# Patient Record
Sex: Male | Born: 1995 | Race: Black or African American | Hispanic: No | Marital: Single | Smoking: Current some day smoker
Health system: Southern US, Community
[De-identification: ages and names within clinical notes are randomized; demographics above are authoritative.]

## PROBLEM LIST (undated history)

## (undated) DIAGNOSIS — Z789 Other specified health status: Secondary | ICD-10-CM

## (undated) HISTORY — PX: NO PAST SURGERIES: SHX2092

---
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
## 2013-01-02 NOTE — Progress Notes (Signed)
Left the message on the VM that Labs good. OV in Feb and he will be called.

## 2013-02-13 ENCOUNTER — Ambulatory Visit: Payer: Self-pay | Admitting: Family Medicine

## 2013-02-17 ENCOUNTER — Other Ambulatory Visit: Payer: Self-pay | Admitting: Gastroenterology

## 2013-02-17 ENCOUNTER — Telehealth: Payer: Self-pay | Admitting: Gastroenterology

## 2013-02-17 DIAGNOSIS — K625 Hemorrhage of anus and rectum: Secondary | ICD-10-CM

## 2013-02-17 MED ORDER — PEG 3350-KCL-NA BICARB-NACL 420 G PO SOLR: 4000.0000 mL | ORAL | Status: DC

## 2013-02-17 NOTE — Telephone Encounter (Signed)
YESTERDAY TOOK SHOWER AND HAD BLOOD RUNNING DOWN LEG. TCS WITH HEMORRHOID BANDING MON. PT MAY HAVE FULL LIQUID DIET MON. MOVIPREP.

## 2013-02-20 ENCOUNTER — Encounter (HOSPITAL_COMMUNITY): Payer: Self-pay | Admitting: *Deleted

## 2013-02-20 ENCOUNTER — Encounter (HOSPITAL_COMMUNITY)
Admission: RE | Disposition: A | Discharge status: Home / Self Care | Payer: Self-pay | Source: Ambulatory Visit | Attending: Gastroenterology

## 2013-02-20 ENCOUNTER — Ambulatory Visit (HOSPITAL_COMMUNITY)
Admission: RE | Admit: 2013-02-20 | Discharge: 2013-02-20 | Disposition: A | Discharge status: Home / Self Care | Payer: BC Managed Care – PPO | Source: Ambulatory Visit | Attending: Gastroenterology | Admitting: Gastroenterology

## 2013-02-20 DIAGNOSIS — K648 Other hemorrhoids: Secondary | ICD-10-CM | POA: Insufficient documentation

## 2013-02-20 DIAGNOSIS — K625 Hemorrhage of anus and rectum: Secondary | ICD-10-CM | POA: Insufficient documentation

## 2013-02-20 HISTORY — PX: COLONOSCOPY: SHX5424

## 2013-02-20 HISTORY — DX: Other specified health status: Z78.9

## 2013-02-20 HISTORY — PX: HEMORRHOID BANDING: SHX5850

## 2013-02-20 SURGERY — COLONOSCOPY
Anesthesia: Moderate Sedation

## 2013-02-20 MED ORDER — MEPERIDINE HCL 100 MG/ML IJ SOLN
INTRAMUSCULAR | Status: DC | PRN
  Administered 2013-02-20 (×2): 25 mg via INTRAVENOUS

## 2013-02-20 MED ORDER — SODIUM CHLORIDE 0.9 % IV SOLN
INTRAVENOUS | Status: DC
  Administered 2013-02-20: 12:00:00 via INTRAVENOUS

## 2013-02-20 MED ORDER — SODIUM CHLORIDE 0.9 % IJ SOLN
INTRAMUSCULAR | Status: AC
  Filled 2013-02-20: qty 10

## 2013-02-20 MED ORDER — MIDAZOLAM HCL 5 MG/5ML IJ SOLN
INTRAMUSCULAR | Status: AC
  Filled 2013-02-20: qty 10

## 2013-02-20 MED ORDER — STERILE WATER FOR IRRIGATION IR SOLN
Status: DC | PRN
  Administered 2013-02-20: 12:00:00

## 2013-02-20 MED ORDER — MIDAZOLAM HCL 5 MG/5ML IJ SOLN
INTRAMUSCULAR | Status: DC | PRN
  Administered 2013-02-20: 2 mg via INTRAVENOUS
  Administered 2013-02-20: 1 mg via INTRAVENOUS
  Administered 2013-02-20: 2 mg via INTRAVENOUS

## 2013-02-20 MED ORDER — PROMETHAZINE HCL 25 MG/ML IJ SOLN
INTRAMUSCULAR | Status: AC
  Filled 2013-02-20: qty 1

## 2013-02-20 MED ORDER — MEPERIDINE HCL 100 MG/ML IJ SOLN
INTRAMUSCULAR | Status: AC
  Filled 2013-02-20: qty 2

## 2013-02-20 NOTE — OR Nursing (Signed)
Pt ambulatory to the bathroom with assistance, family in the room.

## 2013-02-20 NOTE — H&P (Signed)
  Primary Care Physician:  No primary provider on file. Primary Gastroenterologist:  Dr. Darrick PennaFields  Pre-Procedure History & Physical: HPI:  Nathaniel Sparks is a 18 y.o. male here for BRBPR.  Past Medical History  Diagnosis Date  . Medical history non-contributory     Past Surgical History  Procedure Laterality Date  . No past surgeries      Prior to Admission medications   Medication Sig Start Date End Date Taking? Authorizing Provider  polyethylene glycol-electrolytes (TRILYTE) 420 G solution Take 4,000 mLs by mouth as directed. 02/17/13  Yes West BaliSandi L Deloris Moger, MD  hydrocortisone (ANUSOL-HC) 2.5 % rectal cream Place 1 application rectally 4 (four) times daily. For 10 days. 12/01/12   West BaliSandi L Nalani Andreen, MD    Allergies as of 02/17/2013  . (No Known Allergies)    Family History  Problem Relation Age of Onset  . Colon cancer Neg Hx   . Colon polyps Neg Hx     History   Social History  . Marital Status: Single    Spouse Name: N/A    Number of Children: N/A  . Years of Education: N/A   Occupational History  . Not on file.   Social History Main Topics  . Smoking status: Current Some Day Smoker    Types: Cigarettes  . Smokeless tobacco: Not on file  . Alcohol Use: No  . Drug Use: No  . Sexual Activity: Not on file   Other Topics Concern  . Not on file   Social History Narrative  . No narrative on file    Review of Systems: See HPI, otherwise negative ROS   Physical Exam: There were no vitals taken for this visit. General:   Alert,  pleasant and cooperative in NAD Head:  Normocephalic and atraumatic. Neck:  Supple; Lungs:  Clear throughout to auscultation.    Heart:  Regular rate and rhythm. Abdomen:  Soft, nontender and nondistended. Normal bowel sounds, without guarding, and without rebound.   Neurologic:  Alert and  oriented x4;  grossly normal neurologically.  Impression/Plan:    BRBPR  PLAN: TCS/?hemorrhoid banding TODAY

## 2013-02-20 NOTE — Discharge Instructions (Signed)
You have internal hemorrhoids. YOU DID NOT HAVE ANY POLYPS. I PLACED 3 BANDS TO TREAT YOUR HEMORRHOIDAL BLEEDING. YOU MAY SOME MILD BLEEDING OVER THE NEXT 3 TO 5 DAYS.   CALL [phone] IF YOU HAVE A FEVER, A LARGE AMOUNT OF BLEEDING, OR DIFFICULTY URINATING.   DRINK WATER TO KEEP URINE LIGHT YELLOW. YOU MAY USE NAPROXEN TWICE DAILY FOR RECTAL DISCOMFORT. TYLENOL AS NEED FOR ADDITIONAL PAIN RELIEF. YOU MAY USE COLACE TWICE DAILY TO SOFTEN STOOL.  FOLLOW A LOW RESIDUE DIET FOR THE NEXT 2 WEEKS. SEE INFO BELOW. FOLLOW UP IN 3 WEEKS.    Colonoscopy Care After Read the instructions outlined below and refer to this sheet in the next week. These discharge instructions provide you with general information on caring for yourself after you leave the hospital. While your treatment has been planned according to the most current medical practices available, unavoidable complications occasionally occur. If you have any problems or questions after discharge, call DR. Nicodemus Denk, [phone].  ACTIVITY  You may resume your regular activity, but move at a slower pace for the next 24 hours.   Take frequent rest periods for the next 24 hours.   Walking will help get rid of the air and reduce the bloated feeling in your belly (abdomen).   No driving for 24 hours (because of the medicine (anesthesia) used during the test).   You may shower.   Do not sign any important legal documents or operate any machinery for 24 hours (because of the anesthesia used during the test).    NUTRITION  Drink plenty of fluids.   You may resume your normal diet as instructed by your doctor.   Begin with a light meal and progress to your normal diet. Heavy or fried foods are harder to digest and may make you feel sick to your stomach (nauseated).   Avoid alcoholic beverages for 24 hours or as instructed.    MEDICATIONS  You may resume your normal medications.   WHAT YOU CAN EXPECT TODAY  Some feelings of  bloating in the abdomen.   Passage of more gas than usual.   Spotting of blood in your stool or on the toilet paper  .  IF YOU HAD POLYPS REMOVED DURING THE COLONOSCOPY:  Eat a soft diet IF YOU HAVE NAUSEA, BLOATING, ABDOMINAL PAIN, OR VOMITING.    FINDING OUT THE RESULTS OF YOUR TEST Not all test results are available during your visit. DR. Darrick Penna WILL CALL YOU WITHIN 7 DAYS OF YOUR PROCEDUE WITH YOUR RESULTS. Do not assume everything is normal if you have not heard from DR. Velina Drollinger IN ONE WEEK, CALL HER OFFICE AT [phone].  SEEK IMMEDIATE MEDICAL ATTENTION AND CALL THE OFFICE: [phone] IF:  You have more than a spotting of blood in your stool.   Your belly is swollen (abdominal distention).   You are nauseated or vomiting.   You have a temperature over 101F.   You have abdominal pain or discomfort that is severe or gets worse throughout the day.   Hemorrhoids Hemorrhoids are dilated (enlarged) veins around the rectum. Sometimes clots will form in the veins. This makes them swollen and painful. These are called thrombosed hemorrhoids. Causes of hemorrhoids include:  Constipation.   Straining to have a bowel movement.   HEAVY LIFTING HOME CARE INSTRUCTIONS  Eat a well balanced diet and drink 6 to 8 glasses of water every day to avoid constipation. You may also use a bulk laxative.   Avoid straining to  have bowel movements.   Keep anal area dry and clean.   Do not use a donut shaped pillow or sit on the toilet for long periods. This increases blood pooling and pain.   Move your bowels when your body has the urge; this will require less straining and will decrease pain and pressure.  LOW RESIDUE DIET  A low-residue diet, aka low-fiber diet, is usually recommended. An intake of less than 10 grams of fiber per day is generally considered a low-residue diet.  LOW RESIDUE Diet  Grain Products: enriched refined white bread, buns, bagels, English muffins  plain  cereals e.g. Cheerios, Cornflakes, Cream of Wheat, Rice Krispies, Special K  arrowroot cookies, tea biscuits, soda crackers, plain melba toast  white rice, refined pasta and noodles  avoid whole grains   Fruits: fruit juices except prune juice  applesauce, apricots, banana (1/2), cantaloupe, canned fruit cocktail, grapes, honeydew melon, peaches, watermelon  avoid raw and dried fruits, and berries.   Vegetables: vegetable juices  potatoes (no Skin)  alfalfa sprouts, beets, green/yellow beans, carrots, celery, cucumber, eggplant, lettuce, mushrooms, green/red peppers, potatoes (peeled), squash, zucchini  avoid vegetables from the cruciferous family such as broccoli, cauliflower, brussels sprouts, cabbage, kale, Swiss chard, onion, etc   Meat and Protein Choice: well-cooked, tender meat, fish and eggs  avoid beans  avoid all nuts and seeds, as well as foods that may contain seeds (such as yogurt)   Dairy: NO RESTRICTIONS   Drinks: juices  tea and coffee   avoid alcohol

## 2013-02-20 NOTE — Telephone Encounter (Signed)
Pt is scheduled for 1/26 at 11:45am.  Nathaniel GerlachLeigh Ann scheduled this patient last week.

## 2013-02-20 NOTE — Op Note (Signed)
[facility] [address] [place], [id number]   COLONOSCOPY PROCEDURE REPORT  PATIENT: Nathaniel Sparks, Nathaniel S.  MR#: [id number] BIRTHDATE: [birth year] , 17  yrs. old GENDER: Male ENDOSCOPIST: Jonette EvaSandi Niaya Hickok, MD REFERRED NW:GNFAOZBY:Joseph Wende CreaseGuarino, M.D. PROCEDURE DATE:  02/20/2013 PROCEDURE:   Colonoscopy, diagnostic and Hemorrhoidectomy via banding(3) INDICATIONS:Rectal Bleeding. MEDICATIONS: Demerol 50 mg IV and Versed 5 mg IV  DESCRIPTION OF PROCEDURE:    Physical exam was performed.  Informed consent was obtained from the patient after explaining the benefits, risks, and alternatives to procedure.  The patient was connected to monitor and placed in left lateral position. Continuous oxygen was provided by nasal cannula and IV medicine administered through an indwelling cannula.  After administration of sedation and rectal exam, the patients rectum was intubated and the EC-[id number] ([id number]) and [id number] ([id number])  colonoscope was advanced under direct visualization to the ileum.  The scope was removed slowly by carefully examining the color, texture, anatomy, and integrity mucosa on the way out.  The patient was recovered in endoscopy and discharged home in satisfactory condition.    COLON FINDINGS: The mucosa appeared normal in the terminal ileum.  , A normal appearing cecum, ileocecal valve, and appendiceal orifice were identified.  The ascending, hepatic flexure, transverse, splenic flexure, descending, sigmoid colon and rectum appeared unremarkable.  No polyps or cancers were seen.  , and Moderate sized internal hemorrhoids were found.   3 BANDS APPLIED.  PREP QUALITY: excellent.  CECAL W/D TIME: 8 minutes COMPLICATIONS: None  ENDOSCOPIC IMPRESSION: 1.   Normal mucosa in the terminal ileum 2.   Normal colon 3.   RECTAL BLEEDING DUE TO Moderate sized internal hemorrhoids  RECOMMENDATIONS: CALL [phone] IF YOU HAVE A FEVER, A LARGE AMOUNT OF BLEEDING, OR DIFFICULTY  URINATING. DRINK WATER TO KEEP URINE LIGHT YELLOW. MAY USE NAPROXEN TWICE DAILY FOR RECTAL DISCOMFORT.  TYLENOL AS NEED FOR ADDITIONAL PAIN RELIEF. MAY USE COLACE TWICE DAILY TO SOFTEN STOOL. FOLLOW A LOW RESIDUE DIET FOR THE NEXT 2 WEEKS. FOLLOW UP IN 3 WEEKS.       _______________________________ Rosalie DoctoreSignedJonette Eva:  Malary Aylesworth, MD 02/20/2013 12:34 PM

## 2013-02-21 ENCOUNTER — Telehealth: Payer: Self-pay | Admitting: Internal Medicine

## 2013-02-21 NOTE — Telephone Encounter (Signed)
Patient's mother called me last night reporting that the band placed in her son fell off last evening. I told them I would communicate this report to Dr. Darrick Pennafields. Further instructions per Dr. Darrick PennaFields

## 2013-02-21 NOTE — Telephone Encounter (Signed)
MOTHER TEXTED ME LAST NIGHT. PT FEELING OK. REASSURED HER BAND FALLING OFF IS OK.

## 2013-02-24 ENCOUNTER — Encounter (HOSPITAL_COMMUNITY): Payer: Self-pay | Admitting: Gastroenterology

## 2013-03-21 ENCOUNTER — Ambulatory Visit: Payer: BC Managed Care – PPO | Admitting: Gastroenterology

## 2013-04-06 ENCOUNTER — Ambulatory Visit: Payer: BC Managed Care – PPO | Admitting: Gastroenterology

## 2013-04-06 ENCOUNTER — Telehealth: Payer: Self-pay | Admitting: Gastroenterology

## 2013-04-06 NOTE — Telephone Encounter (Signed)
Please send letter.

## 2013-04-06 NOTE — Telephone Encounter (Signed)
Pt was a no show

## 2013-04-10 ENCOUNTER — Encounter: Payer: Self-pay | Admitting: Gastroenterology

## 2013-04-10 NOTE — Telephone Encounter (Signed)
MAILED LETTER °

## 2013-06-09 ENCOUNTER — Ambulatory Visit: Payer: Self-pay | Admitting: Pediatrics

## 2014-05-04 ENCOUNTER — Emergency Department (HOSPITAL_COMMUNITY)
Admission: EM | Admit: 2014-05-04 | Discharge: 2014-05-04 | Disposition: A | Discharge status: Home / Self Care | Payer: Self-pay | Attending: Emergency Medicine | Admitting: Emergency Medicine

## 2014-05-04 ENCOUNTER — Encounter (HOSPITAL_COMMUNITY): Payer: Self-pay | Admitting: *Deleted

## 2014-05-04 ENCOUNTER — Emergency Department (HOSPITAL_COMMUNITY)
Admission: EM | Admit: 2014-05-04 | Discharge: 2014-05-04 | Disposition: A | Discharge status: Home / Self Care | Payer: BLUE CROSS/BLUE SHIELD | Attending: Emergency Medicine | Admitting: Emergency Medicine

## 2014-05-04 ENCOUNTER — Encounter (HOSPITAL_COMMUNITY): Payer: Self-pay | Admitting: Emergency Medicine

## 2014-05-04 DIAGNOSIS — K088 Other specified disorders of teeth and supporting structures: Secondary | ICD-10-CM | POA: Diagnosis not present

## 2014-05-04 DIAGNOSIS — R1110 Vomiting, unspecified: Secondary | ICD-10-CM | POA: Diagnosis not present

## 2014-05-04 DIAGNOSIS — T391X5A Adverse effect of 4-Aminophenol derivatives, initial encounter: Secondary | ICD-10-CM | POA: Diagnosis not present

## 2014-05-04 DIAGNOSIS — K011 Impacted teeth: Secondary | ICD-10-CM | POA: Insufficient documentation

## 2014-05-04 DIAGNOSIS — Z720 Tobacco use: Secondary | ICD-10-CM | POA: Diagnosis not present

## 2014-05-04 DIAGNOSIS — K0889 Other specified disorders of teeth and supporting structures: Secondary | ICD-10-CM

## 2014-05-04 DIAGNOSIS — T50905A Adverse effect of unspecified drugs, medicaments and biological substances, initial encounter: Secondary | ICD-10-CM

## 2014-05-04 LAB — BASIC METABOLIC PANEL
Anion gap: 8 (ref 5–15)
BUN: 12 mg/dL (ref 6–23)
CALCIUM: 9.6 mg/dL (ref 8.4–10.5)
CHLORIDE: 105 mmol/L (ref 96–112)
CO2: 25 mmol/L (ref 19–32)
Creatinine, Ser: 0.94 mg/dL (ref 0.50–1.35)
GFR calc Af Amer: >90 mL/min (ref >90)
GFR calc non Af Amer: >90 mL/min (ref >90)
GLUCOSE: 103 mg/dL — AB (ref 70–99)
Potassium: 4.2 mmol/L (ref 3.5–5.1)
Sodium: 138 mmol/L (ref 135–145)

## 2014-05-04 LAB — CBC WITH DIFFERENTIAL/PLATELET
BASOS ABS: 0 10*3/uL (ref 0.0–0.1)
BASOS PCT: 0 % (ref 0–1)
EOS ABS: 0 10*3/uL (ref 0.0–0.7)
EOS PCT: 0 % (ref 0–5)
HCT: 40.7 % (ref 39.0–52.0)
Hemoglobin: 13.9 g/dL (ref 13.0–17.0)
LYMPHS PCT: 10 % — AB (ref 12–46)
Lymphs Abs: 0.8 10*3/uL (ref 0.7–4.0)
MCH: 29.8 pg (ref 26.0–34.0)
MCHC: 34.2 g/dL (ref 30.0–36.0)
MCV: 87.2 fL (ref 78.0–100.0)
Monocytes Absolute: 0.4 10*3/uL (ref 0.1–1.0)
Monocytes Relative: 6 % (ref 3–12)
NEUTROS ABS: 6.6 10*3/uL (ref 1.7–7.7)
Neutrophils Relative %: 84 % — ABNORMAL HIGH (ref 43–77)
PLATELETS: 230 10*3/uL (ref 150–400)
RBC: 4.67 MIL/uL (ref 4.22–5.81)
RDW: 13.3 % (ref 11.5–15.5)
WBC: 7.9 10*3/uL (ref 4.0–10.5)

## 2014-05-04 MED ORDER — AMOXICILLIN 250 MG PO CAPS
1000.0000 mg | ORAL_CAPSULE | Freq: Three times a day (TID) | ORAL | Status: DC
  Administered 2014-05-04: 1000 mg via ORAL
  Filled 2014-05-04: qty 4

## 2014-05-04 MED ORDER — OXYCODONE-ACETAMINOPHEN 5-325 MG PO TABS
1.0000 | ORAL_TABLET | ORAL | Status: DC | PRN
Start: 2014-05-04 — End: 2015-10-09

## 2014-05-04 MED ORDER — AMOXICILLIN 500 MG PO CAPS: 1000.0000 mg | ORAL_CAPSULE | Freq: Two times a day (BID) | ORAL | Status: DC

## 2014-05-04 MED ORDER — TRAMADOL HCL 50 MG PO TABS: 50.0000 mg | ORAL_TABLET | Freq: Four times a day (QID) | ORAL | Status: DC | PRN

## 2014-05-04 NOTE — Discharge Instructions (Signed)
Do not take more than 2 acetaminophen or enthesis Tylenol) tablets at a time, and do not take it any more than every 4 hours. You may take 3 ibuprofen (Advil, Motrin) every 6 hours as needed for pain. Reserve tramadol for pain not relieved by ibuprofen. You will need to see a dentist for definitive care the tooth. Because it is your wisdom tooth, the dentist may refer you to an oral surgeon.  Dental Pain A tooth ache may be caused by cavities (tooth decay). Cavities expose the nerve of the tooth to air and hot or cold temperatures. It may come from an infection or abscess (also called a boil or furuncle) around your tooth. It is also often caused by dental caries (tooth decay). This causes the pain you are having. DIAGNOSIS  Your caregiver can diagnose this problem by exam. TREATMENT   If caused by an infection, it may be treated with medications which kill germs (antibiotics) and pain medications as prescribed by your caregiver. Take medications as directed.  Only take over-the-counter or prescription medicines for pain, discomfort, or fever as directed by your caregiver.  Whether the tooth ache today is caused by infection or dental disease, you should see your dentist as soon as possible for further care. SEEK MEDICAL CARE IF: The exam and treatment you received today has been provided on an emergency basis only. This is not a substitute for complete medical or dental care. If your problem worsens or new problems (symptoms) appear, and you are unable to meet with your dentist, call or return to this location. SEEK IMMEDIATE MEDICAL CARE IF:   You have a fever.  You develop redness and swelling of your face, jaw, or neck.  You are unable to open your mouth.  You have severe pain uncontrolled by pain medicine. MAKE SURE YOU:   Understand these instructions.  Will watch your condition.  Will get help right away if you are not doing well or get worse. Document Released: 01/12/2005  Document Revised: 04/06/2011 Document Reviewed: 08/31/2007 [facility] Patient Information 2015 [place], [place]. This information is not intended to replace advice given to you by your health care provider. Make sure you discuss any questions you have with your health care provider.  Amoxicillin capsules or tablets What is this medicine? AMOXICILLIN (a mox i SIL in) is a penicillin antibiotic. It is used to treat certain kinds of bacterial infections. It will not work for colds, flu, or other viral infections. This medicine may be used for other purposes; ask your health care provider or pharmacist if you have questions. COMMON BRAND NAME(S): Amoxil, Moxilin, Sumox, Trimox What should I tell my health care provider before I take this medicine? They need to know if you have any of these conditions: -asthma -kidney disease -an unusual or allergic reaction to amoxicillin, other penicillins, cephalosporin antibiotics, other medicines, foods, dyes, or preservatives -pregnant or trying to get pregnant -breast-feeding How should I use this medicine? Take this medicine by mouth with a glass of water. Follow the directions on your prescription label. You may take this medicine with food or on an empty stomach. Take your medicine at regular intervals. Do not take your medicine more often than directed. Take all of your medicine as directed even if you think your are better. Do not skip doses or stop your medicine early. Talk to your pediatrician regarding the use of this medicine in children. While this drug may be prescribed for selected conditions, precautions do apply. Overdosage: If  you think you have taken too much of this medicine contact a poison control center or emergency room at once. NOTE: This medicine is only for you. Do not share this medicine with others. What if I miss a dose? If you miss a dose, take it as soon as you can. If it is almost time for your next dose, take only that dose. Do not  take double or extra doses. What may interact with this medicine? -amiloride -birth control pills -chloramphenicol -macrolides -probenecid -sulfonamides -tetracyclines This list may not describe all possible interactions. Give your health care provider a list of all the medicines, herbs, non-prescription drugs, or dietary supplements you use. Also tell them if you smoke, drink alcohol, or use illegal drugs. Some items may interact with your medicine. What should I watch for while using this medicine? Tell your doctor or health care professional if your symptoms do not improve in 2 or 3 days. Take all of the doses of your medicine as directed. Do not skip doses or stop your medicine early. If you are diabetic, you may get a false positive result for sugar in your urine with certain brands of urine tests. Check with your doctor. Do not treat diarrhea with over-the-counter products. Contact your doctor if you have diarrhea that lasts more than 2 days or if the diarrhea is severe and watery. What side effects may I notice from receiving this medicine? Side effects that you should report to your doctor or health care professional as soon as possible: -allergic reactions like skin rash, itching or hives, swelling of the face, lips, or tongue -breathing problems -dark urine -redness, blistering, peeling or loosening of the skin, including inside the mouth -seizures -severe or watery diarrhea -trouble passing urine or change in the amount of urine -unusual bleeding or bruising -unusually weak or tired -yellowing of the eyes or skin Side effects that usually do not require medical attention (report to your doctor or health care professional if they continue or are bothersome): -dizziness -headache -stomach upset -trouble sleeping This list may not describe all possible side effects. Call your doctor for medical advice about side effects. You may report side effects to FDA at  1-800-FDA-1088. Where should I keep my medicine? Keep out of the reach of children. Store between 68 and 77 degrees F (20 and 25 degrees C). Keep bottle closed tightly. Throw away any unused medicine after the expiration date. NOTE: This sheet is a summary. It may not cover all possible information. If you have questions about this medicine, talk to your doctor, pharmacist, or health care provider.  2015, Elsevier/Gold Standard. (2007-04-05 14:10:59)  Tramadol tablets What is this medicine? TRAMADOL (TRA ma dole) is a pain reliever. It is used to treat moderate to severe pain in adults. This medicine may be used for other purposes; ask your health care provider or pharmacist if you have questions. COMMON BRAND NAME(S): Ultram What should I tell my health care provider before I take this medicine? They need to know if you have any of these conditions: -brain tumor -depression -drug abuse or addiction -head injury -if you frequently drink alcohol containing drinks -kidney disease or trouble passing urine -liver disease -lung disease, asthma, or breathing problems -seizures or epilepsy -suicidal thoughts, plans, or attempt; a previous suicide attempt by you or a family member -an unusual or allergic reaction to tramadol, codeine, other medicines, foods, dyes, or preservatives -pregnant or trying to get pregnant -breast-feeding How should I use this medicine? Take  this medicine by mouth with a full glass of water. Follow the directions on the prescription label. If the medicine upsets your stomach, take it with food or milk. Do not take more medicine than you are told to take. Talk to your pediatrician regarding the use of this medicine in children. Special care may be needed. Overdosage: If you think you have taken too much of this medicine contact a poison control center or emergency room at once. NOTE: This medicine is only for you. Do not share this medicine with others. What if I  miss a dose? If you miss a dose, take it as soon as you can. If it is almost time for your next dose, take only that dose. Do not take double or extra doses. What may interact with this medicine? Do not take this medicine with any of the following medications: -MAOIs like Carbex, Eldepryl, Marplan, Nardil, and Parnate This medicine may also interact with the following medications: -alcohol or medicines that contain alcohol -antihistamines -benzodiazepines -bupropion -carbamazepine or oxcarbazepine -clozapine -cyclobenzaprine -digoxin -furazolidone -linezolid -medicines for depression, anxiety, or psychotic disturbances -medicines for migraine headache like almotriptan, eletriptan, frovatriptan, naratriptan, rizatriptan, sumatriptan, zolmitriptan -medicines for pain like pentazocine, buprenorphine, butorphanol, meperidine, nalbuphine, and propoxyphene -medicines for sleep -muscle relaxants -naltrexone -phenobarbital -phenothiazines like perphenazine, thioridazine, chlorpromazine, mesoridazine, fluphenazine, prochlorperazine, promazine, and trifluoperazine -procarbazine -warfarin This list may not describe all possible interactions. Give your health care provider a list of all the medicines, herbs, non-prescription drugs, or dietary supplements you use. Also tell them if you smoke, drink alcohol, or use illegal drugs. Some items may interact with your medicine. What should I watch for while using this medicine? Tell your doctor or health care professional if your pain does not go away, if it gets worse, or if you have new or a different type of pain. You may develop tolerance to the medicine. Tolerance means that you will need a higher dose of the medicine for pain relief. Tolerance is normal and is expected if you take this medicine for a long time. Do not suddenly stop taking your medicine because you may develop a severe reaction. Your body becomes used to the medicine. This does NOT  mean you are addicted. Addiction is a behavior related to getting and using a drug for a non-medical reason. If you have pain, you have a medical reason to take pain medicine. Your doctor will tell you how much medicine to take. If your doctor wants you to stop the medicine, the dose will be slowly lowered over time to avoid any side effects. You may get drowsy or dizzy. Do not drive, use machinery, or do anything that needs mental alertness until you know how this medicine affects you. Do not stand or sit up quickly, especially if you are an older patient. This reduces the risk of dizzy or fainting spells. Alcohol can increase or decrease the effects of this medicine. Avoid alcoholic drinks. You may have constipation. Try to have a bowel movement at least every 2 to 3 days. If you do not have a bowel movement for 3 days, call your doctor or health care professional. Your mouth may get dry. Chewing sugarless gum or sucking hard candy, and drinking plenty of water may help. Contact your doctor if the problem does not go away or is severe. What side effects may I notice from receiving this medicine? Side effects that you should report to your doctor or health care professional as soon as possible: -allergic  reactions like skin rash, itching or hives, swelling of the face, lips, or tongue -breathing difficulties, wheezing -confusion -itching -light headedness or fainting spells -redness, blistering, peeling or loosening of the skin, including inside the mouth -seizures Side effects that usually do not require medical attention (report to your doctor or health care professional if they continue or are bothersome): -constipation -dizziness -drowsiness -headache -nausea, vomiting This list may not describe all possible side effects. Call your doctor for medical advice about side effects. You may report side effects to FDA at 1-800-FDA-1088. Where should I keep my medicine? Keep out of the reach of  children. Store at room temperature between 15 and 30 degrees C (59 and 86 degrees F). Keep container tightly closed. Throw away any unused medicine after the expiration date. NOTE: This sheet is a summary. It may not cover all possible information. If you have questions about this medicine, talk to your doctor, pharmacist, or health care provider.  2015, Elsevier/Gold Standard. (2009-09-25 11:55:44)   Emergency Department Resource Guide  Dental Care: Organization         Address  Phone  Notes  [facility] [address], [place] [phone] Accepts children up to age 21 who are enrolled in [place] or [place] Health Choice; pregnant women with a Medicaid card; and children who have applied for Medicaid or [place] Health Choice, but were declined, whose parents can pay a reduced fee at time of service.  [facility] [place]  [address], [place] [phone] Accepts children up to age 21 who are enrolled in [place] or [place] Health Choice; pregnant women with a Medicaid card; and children who have applied for Medicaid or [place] Health Choice, but were declined, whose parents can pay a reduced fee at time of service.  [facility]  [address], [place] [phone] Patients are seen by appointment only. Walk-ins are not accepted. [facility] will see patients 21 years of age and older. Monday - Tuesday (8am-5pm) Most Wednesdays (8:30-5pm) $30 per visit, cash only  [facility]  [address], [place] [phone] Patients are seen by appointment only. Walk-ins are not accepted. [facility] will see patients 21 years of age and older. One Wednesday Evening (Monthly: Volunteer Based).  $30 per visit, cash only  [facility]  [phone] for adults; Children under age 494, call Graduate Pediatric  Dentistry at [phone]) [phone]. Children aged 514-14, please call [phone] to request a pediatric application.  Dental services are provided in all areas of dental care including fillings, crowns and bridges, complete and partial dentures, implants, gum treatment, root canals, and extractions. Preventive care is also provided. Treatment is provided to both adults and children. Patients are selected via a lottery and there is often a waiting list.   [facility] [address], [place]  [phone] [email or web address]   [facility] [address], [place], [place] [phone], Ext. [phone] Second and Fourth Thursday of each month, opens at 6:30 AM; Clinic ends at 9 AM.  Patients are seen on a first-come first-served basis, and a limited number are seen during each clinic.   [facility]  [address], [place], [place] [phone]   Eligibility Requirements You must have lived in [place], [place], or [place] counties for at least the last  three months.   You cannot be eligible for state or federal sponsored [place], including [employer], [place], or [employer].   You generally cannot be eligible for healthcare insurance through your employer.    How to apply: Eligibility screenings are held every Tuesday and Wednesday afternoon from 1:00 pm until 4:00 pm. You do not need an appointment for the interview!  [facility] [address], [place], [place] [phone]   [facility] Health Department  [phone]   [facility] Department  [phone]   [facility] Health Department  [phone]

## 2014-05-04 NOTE — ED Notes (Signed)
Patient with no complaints at this time. Respirations even and unlabored. Skin warm/dry. Discharge instructions reviewed with patient at this time. Patient given opportunity to voice concerns/ask questions. Patient discharged at this time and left Emergency Department with steady gait.   

## 2014-05-04 NOTE — ED Provider Notes (Signed)
CSN: [id number]     Arrival date & time 05/04/14  1937 History  This chart was scribe for Lorre NickAnthony Briella Hobday, MD by Angelene GiovanniEmmanuella Mensah, ED Scribe. The patient was seen in room APA12/APA12 and the patient's care was started at 10:57 PM.      Chief Complaint  Patient presents with  . Emesis   The history is provided by the patient. No language interpreter was used.   HPI Comments: Nathaniel Sparks is a 19 y.o. male who presents to the Emergency Department complaining of dental pain onset today. His father reports that he saw him shaking and went to the commode as if he would vomit but was unable to vomit. He denies abdominal pain. Pt felt a little better after putting a cold water towel on his face. He states that he has been taking his medication on an empty stomach.. He reports that he has been taking Tylenol in addition to his Percocet today. He has taken 20 Tylenol over the course of 24 hours. He denies that he has started his antibiotics yet. He reports that he will see a Dentist for these symptoms.   Past Medical History  Diagnosis Date  . Medical history non-contributory    Past Surgical History  Procedure Laterality Date  . No past surgeries    . Colonoscopy N/A 02/20/2013    UYQ:[id number]:Normal mucosa in the terminal ileum/Normal colon/RECTAL BLEEDING DUE TO Moderate sized internal hemorrhoids  . Hemorrhoid banding N/A 02/20/2013    Procedure: HEMORRHOID BANDING;  Surgeon: West BaliSandi L Fields, MD;  Location: [facility];  Service: Endoscopy;  Laterality: N/A;   Family History  Problem Relation Age of Onset  . Colon cancer Neg Hx   . Colon polyps Neg Hx    History  Substance Use Topics  . Smoking status: Current Some Day Smoker    Types: Cigarettes  . Smokeless tobacco: Not on file  . Alcohol Use: No    Review of Systems  Constitutional: Negative for fever.  HENT: Positive for dental problem.   Gastrointestinal: Negative for nausea, vomiting and abdominal pain.  All other systems reviewed  and are negative.     Allergies  Review of patient's allergies indicates no known allergies.  Home Medications   Prior to Admission medications   Medication Sig Start Date End Date Taking? Authorizing Provider  amoxicillin (AMOXIL) 500 MG capsule Take 2 capsules (1,000 mg total) by mouth 2 (two) times daily. 05/04/14   Dione Boozeavid Glick, MD  oxyCODONE-acetaminophen (PERCOCET) 5-325 MG per tablet Take 1 tablet by mouth every 4 (four) hours as needed for moderate pain. 05/04/14   Dione Boozeavid Glick, MD  traMADol (ULTRAM) 50 MG tablet Take 1 tablet (50 mg total) by mouth every 6 (six) hours as needed. 05/04/14   Dione Boozeavid Glick, MD   BP 152/103 mmHg  Pulse 90  Temp(Src) 97.8 F (36.6 C) (Oral)  Resp 20  Ht 6\' 3"  (1.905 m)  Wt 160 lb (72.576 kg)  BMI 20.00 kg/m2  SpO2 100% Physical Exam  Constitutional: He is oriented to person, place, and time. He appears well-developed and well-nourished.  Non-toxic appearance.  HENT:  Head: Normocephalic and atraumatic.  Mouth/Throat: No dental abscesses.  No evidence of intra-oral abscess   Eyes: Conjunctivae are normal. Pupils are equal, round, and reactive to light.  Neck: Normal range of motion.  Cardiovascular: Normal rate.   Pulmonary/Chest: Effort normal.  Lymphadenopathy:    He has no cervical adenopathy.  Neurological: He is alert and oriented  to person, place, and time.  Skin: Skin is warm and dry.  Psychiatric: He has a normal mood and affect.  Nursing note and vitals reviewed.   ED Course  Procedures (including critical care time) DIAGNOSTIC STUDIES: Oxygen Saturation is 100% on RA, normal by my interpretation.    COORDINATION OF CARE: 11:03 PM- Pt advised of plan for treatment and pt agrees.    Labs Review Labs Reviewed  CBC WITH DIFFERENTIAL/PLATELET - Abnormal; Notable for the following:    Neutrophils Relative % 84 (*)    Lymphocytes Relative 10 (*)    All other components within normal limits  BASIC METABOLIC PANEL - Abnormal;  Notable for the following:    Glucose, Bld 103 (*)    All other components within normal limits    Imaging Review No results found.   EKG Interpretation None      MDM   Final diagnoses:  None    I personally performed the services described in this documentation, which was scribed in my presence. The recorded information has been reviewed and is accurate.  Patient with likely reaction to taking pain meds on empty stomach. He was counseled to not use any more Tylenol. He has no abdominal pain at this time. He feels back to his baseline. Strict return precautions given  Lorre Nick, MD 05/04/14 2308

## 2014-05-04 NOTE — ED Provider Notes (Signed)
CSN: [id number]     Arrival date & time 05/04/14  0433 History   First MD Initiated Contact with Patient 05/04/14 0449     Chief Complaint  Patient presents with  . Dental Pain     (Consider location/radiation/quality/duration/timing/severity/associated sxs/prior Treatment) Patient is a 19 y.o. male presenting with tooth pain. The history is provided by the patient.  Dental Pain He has been having pain in the left side of his mouth for about the last month. Tonight, it got so severe that he was not able to sleep. He rates pain at 10/10. Nothing makes it better nothing makes it worse. He has been taking acetaminophen without relief.   Past Medical History  Diagnosis Date  . Medical history non-contributory    Past Surgical History  Procedure Laterality Date  . No past surgeries    . Colonoscopy N/A 02/20/2013    WUJ:WJXBJYSLF:Normal mucosa in the terminal ileum/Normal colon/RECTAL BLEEDING DUE TO Moderate sized internal hemorrhoids  . Hemorrhoid banding N/A 02/20/2013    Procedure: HEMORRHOID BANDING;  Surgeon: West BaliSandi L Fields, MD;  Location: [facility];  Service: Endoscopy;  Laterality: N/A;   Family History  Problem Relation Age of Onset  . Colon cancer Neg Hx   . Colon polyps Neg Hx    History  Substance Use Topics  . Smoking status: Current Some Day Smoker    Types: Cigarettes  . Smokeless tobacco: Not on file  . Alcohol Use: No    Review of Systems  All other systems reviewed and are negative.     Allergies  Review of patient's allergies indicates no known allergies.  Home Medications   Prior to Admission medications   Not on File   BP 142/110 mmHg  Pulse 51  Temp(Src) 98 F (36.7 C)  Resp 18  Ht 6\' 3"  (1.905 m)  Wt 160 lb (72.576 kg)  BMI 20.00 kg/m2  SpO2 98% Physical Exam  Nursing note and vitals reviewed.  19 year old male, resting comfortably and in no acute distress. Vital signs are significant for bradycardia and hypertension. Oxygen saturation is 98%,  which is normal. Head is normocephalic and atraumatic. PERRLA, EOMI. Oropharynx is clear. There are no obvious caries, but there is marked tenderness to percussion over tooth #17. Teeth #1 and 16 have fully interrupted as well. Tooth #32 has not interrupted. Neck is nontender and supple without adenopathy or JVD. Back is nontender and there is no CVA tenderness. Lungs are clear without rales, wheezes, or rhonchi. Chest is nontender. Heart has regular rate and rhythm without murmur. Abdomen is soft, flat, nontender without masses or hepatosplenomegaly and peristalsis is normoactive. Extremities have no cyanosis or edema, full range of motion is present. Skin is warm and dry without rash. Neurologic: Mental status is normal, cranial nerves are intact, there are no motor or sensory deficits.  ED Course  Procedures (including critical care time)   MDM   Final diagnoses:  Pain, dental  Impacted third molar tooth    Pain in tooth #17 without obvious caries. He is referred to his dentist for definitive care. In the meantime, he is given prescriptions for amoxicillin and tramadol as well as a take-home pack of oxycodone-acetaminophen.    Dione Boozeavid Jencarlos Nicolson, MD 05/04/14 [phone]

## 2014-05-04 NOTE — Discharge Instructions (Signed)
Do not take any Tylenol. Use ibuprofen only as a needed for pain. Start the antibiotics. Return here for abdominal pain, vomiting, or any other problems. Eat when you take your medications

## 2014-05-04 NOTE — ED Notes (Signed)
Pt c/o dental pain for a while but worse tonight.

## 2014-05-04 NOTE — ED Notes (Signed)
Seen here today for dental pain, No dizzy with vomiting.

## 2014-05-08 MED FILL — Oxycodone w/ Acetaminophen Tab 5-325 MG: ORAL | Qty: 6 | Status: AC

## 2014-08-06 ENCOUNTER — Encounter (HOSPITAL_COMMUNITY): Payer: Self-pay | Admitting: Emergency Medicine

## 2014-08-06 ENCOUNTER — Emergency Department (HOSPITAL_COMMUNITY): Payer: BLUE CROSS/BLUE SHIELD

## 2014-08-06 ENCOUNTER — Inpatient Hospital Stay (HOSPITAL_COMMUNITY)
Admission: EM | Admit: 2014-08-06 | Discharge: 2014-08-08 | DRG: 872 | Discharge status: Against Medical Advice | Payer: BLUE CROSS/BLUE SHIELD | Attending: Family Medicine | Admitting: Family Medicine

## 2014-08-06 DIAGNOSIS — A400 Sepsis due to streptococcus, group A: Secondary | ICD-10-CM

## 2014-08-06 DIAGNOSIS — N289 Disorder of kidney and ureter, unspecified: Secondary | ICD-10-CM

## 2014-08-06 DIAGNOSIS — J020 Streptococcal pharyngitis: Secondary | ICD-10-CM | POA: Diagnosis present

## 2014-08-06 DIAGNOSIS — E860 Dehydration: Secondary | ICD-10-CM

## 2014-08-06 DIAGNOSIS — D72829 Elevated white blood cell count, unspecified: Secondary | ICD-10-CM

## 2014-08-06 DIAGNOSIS — A419 Sepsis, unspecified organism: Principal | ICD-10-CM | POA: Diagnosis present

## 2014-08-06 DIAGNOSIS — F17210 Nicotine dependence, cigarettes, uncomplicated: Secondary | ICD-10-CM | POA: Diagnosis present

## 2014-08-06 DIAGNOSIS — R509 Fever, unspecified: Secondary | ICD-10-CM

## 2014-08-06 LAB — BASIC METABOLIC PANEL
ANION GAP: 9 (ref 5–15)
BUN: 18 mg/dL (ref 6–20)
CALCIUM: 9 mg/dL (ref 8.9–10.3)
CO2: 25 mmol/L (ref 22–32)
Chloride: 104 mmol/L (ref 101–111)
Creatinine, Ser: 1.28 mg/dL — ABNORMAL HIGH (ref 0.61–1.24)
GFR calc Af Amer: >60 mL/min (ref >60)
Glucose, Bld: 92 mg/dL (ref 65–99)
Potassium: 3.9 mmol/L (ref 3.5–5.1)
Sodium: 138 mmol/L (ref 135–145)

## 2014-08-06 LAB — RAPID STREP SCREEN (MED CTR MEBANE ONLY): Streptococcus, Group A Screen (Direct): POSITIVE — AB

## 2014-08-06 LAB — CBC WITH DIFFERENTIAL/PLATELET
BASOS ABS: 0 10*3/uL (ref 0.0–0.1)
Basophils Relative: 0 % (ref 0–1)
EOS PCT: 0 % (ref 0–5)
Eosinophils Absolute: 0 10*3/uL (ref 0.0–0.7)
HEMATOCRIT: 44.7 % (ref 39.0–52.0)
Hemoglobin: 15.2 g/dL (ref 13.0–17.0)
LYMPHS ABS: 0.9 10*3/uL (ref 0.7–4.0)
Lymphocytes Relative: 4 % — ABNORMAL LOW (ref 12–46)
MCH: 30.2 pg (ref 26.0–34.0)
MCHC: 34 g/dL (ref 30.0–36.0)
MCV: 88.7 fL (ref 78.0–100.0)
MONOS PCT: 10 % (ref 3–12)
Monocytes Absolute: 2.1 10*3/uL — ABNORMAL HIGH (ref 0.1–1.0)
NEUTROS ABS: 18 10*3/uL — AB (ref 1.7–7.7)
NEUTROS PCT: 86 % — AB (ref 43–77)
Platelets: 195 10*3/uL (ref 150–400)
RBC: 5.04 MIL/uL (ref 4.22–5.81)
RDW: 13.4 % (ref 11.5–15.5)
WBC: 21 10*3/uL — ABNORMAL HIGH (ref 4.0–10.5)

## 2014-08-06 MED ORDER — IBUPROFEN 100 MG/5ML PO SUSP
400.0000 mg | Freq: Once | ORAL | Status: AC
  Administered 2014-08-06: 400 mg via ORAL
  Filled 2014-08-06: qty 20

## 2014-08-06 MED ORDER — CLINDAMYCIN PHOSPHATE 900 MG/50ML IV SOLN
900.0000 mg | Freq: Once | INTRAVENOUS | Status: AC
  Administered 2014-08-06: 900 mg via INTRAVENOUS
  Filled 2014-08-06: qty 50

## 2014-08-06 MED ORDER — IOHEXOL 300 MG/ML  SOLN
75.0000 mL | Freq: Once | INTRAMUSCULAR | Status: AC | PRN
Start: 2014-08-06 — End: 2014-08-06
  Administered 2014-08-06: 75 mL via INTRAVENOUS

## 2014-08-06 MED ORDER — SODIUM CHLORIDE 0.9 % IV BOLUS (SEPSIS)
500.0000 mL | Freq: Once | INTRAVENOUS | Status: AC
  Administered 2014-08-06: 500 mL via INTRAVENOUS

## 2014-08-06 MED ORDER — ACETAMINOPHEN 160 MG/5ML PO SOLN
650.0000 mg | Freq: Once | ORAL | Status: AC
  Administered 2014-08-06: 650 mg via ORAL
  Filled 2014-08-06: qty 20.3

## 2014-08-06 MED ORDER — ONDANSETRON HCL 4 MG PO TABS: 4.0000 mg | ORAL_TABLET | Freq: Four times a day (QID) | ORAL | Status: DC | PRN

## 2014-08-06 MED ORDER — DEXTROSE 5 % IV SOLN
1.0000 g | INTRAVENOUS | Status: DC
  Administered 2014-08-07: 1 g via INTRAVENOUS
  Filled 2014-08-06 (×2): qty 10

## 2014-08-06 MED ORDER — DEXAMETHASONE SODIUM PHOSPHATE 4 MG/ML IJ SOLN
10.0000 mg | Freq: Once | INTRAMUSCULAR | Status: AC
  Administered 2014-08-06: 10 mg via INTRAVENOUS
  Filled 2014-08-06: qty 3

## 2014-08-06 MED ORDER — SODIUM CHLORIDE 0.9 % IV SOLN
INTRAVENOUS | Status: DC
  Administered 2014-08-06 – 2014-08-07 (×5): via INTRAVENOUS

## 2014-08-06 MED ORDER — ENOXAPARIN SODIUM 40 MG/0.4ML ~~LOC~~ SOLN
40.0000 mg | SUBCUTANEOUS | Status: DC
  Administered 2014-08-06 – 2014-08-07 (×2): 40 mg via SUBCUTANEOUS
  Filled 2014-08-06 (×2): qty 0.4

## 2014-08-06 MED ORDER — SODIUM CHLORIDE 0.9 % IV SOLN: INTRAVENOUS | Status: AC

## 2014-08-06 MED ORDER — ONDANSETRON HCL 4 MG/2ML IJ SOLN: 4.0000 mg | Freq: Four times a day (QID) | INTRAMUSCULAR | Status: DC | PRN

## 2014-08-06 NOTE — H&P (Signed)
[facility] History and Physical  Nathaniel Sparks RUE:[id number] DOB: 07-Jan-1996 DOA: 08/06/2014  Referring physician: ER, Dr. Clarene DukeMcManus PCP: No primary care provider on file.   Chief Complaint: Sore throat. Poor by mouth intake.  HPI: Nathaniel Balecorey S Palmero is a 19 y.o. male  This is a 19 year old man who gives a 3 to four-day history of worsening sore throat associated with poor by mouth intake. He has had subjective fevers. He has had no nausea or vomiting. There is no abdominal pain. He does not have lightheadedness or dizziness. Evaluation in the emergency room showed him to be clinically and biochemically dehydrated. His strep test is positive. He is now being admitted for further management.   Review of Systems:  Apart from symptoms above, all systems negative.  Past Medical History  Diagnosis Date  . Medical history non-contributory    Past Surgical History  Procedure Laterality Date  . No past surgeries    . Colonoscopy N/A 02/20/2013    JYN:WGNFAOSLF:Normal mucosa in the terminal ileum/Normal colon/RECTAL BLEEDING DUE TO Moderate sized internal hemorrhoids  . Hemorrhoid banding N/A 02/20/2013    Procedure: HEMORRHOID BANDING;  Surgeon: West BaliSandi L Fields, MD;  Location: [facility];  Service: Endoscopy;  Laterality: N/A;   Social History:  reports that he has been smoking Cigarettes.  He has been smoking about 0.50 packs per day. He does not have any smokeless tobacco history on file. He reports that he does not drink alcohol or use illicit drugs.  No Known Allergies  Family History  Problem Relation Age of Onset  . Colon cancer Neg Hx   . Colon polyps Neg Hx     Prior to Admission medications   Medication Sig Start Date End Date Taking? Authorizing Provider  amoxicillin (AMOXIL) 500 MG capsule Take 2 capsules (1,000 mg total) by mouth 2 (two) times daily. Patient not taking: Reported on 08/06/2014 05/04/14   Dione Boozeavid Glick, MD  oxyCODONE-acetaminophen (PERCOCET) 5-325 MG per tablet  Take 1 tablet by mouth every 4 (four) hours as needed for moderate pain. Patient not taking: Reported on 08/06/2014 05/04/14   Dione Boozeavid Glick, MD  traMADol (ULTRAM) 50 MG tablet Take 1 tablet (50 mg total) by mouth every 6 (six) hours as needed. Patient not taking: Reported on 08/06/2014 05/04/14   Dione Boozeavid Glick, MD   Physical Exam: Filed Vitals:   08/06/14 1332 08/06/14 1558 08/06/14 1600 08/06/14 1630  BP: 130/80 123/70 126/82 124/87  Pulse: 97 92 90 84  Temp: 102.1 F (38.9 C) 98.8 F (37.1 C)    TempSrc: Oral Oral    Resp: 12 16    Height: 6\' 3"  (1.905 m)     Weight: 72.576 kg (160 lb)     SpO2: 100% 99% 99% 98%    Wt Readings from Last 3 Encounters:  08/06/14 72.576 kg (160 lb)  05/04/14 72.576 kg (160 lb)  05/04/14 72.576 kg (160 lb)    General:  Appears clinically dehydrated. Eyes: PERRL, normal lids, irises & conjunctiva ENT: His throat is mildly inflamed. I cannot see any exudates.  Neck: no LAD, masses or thyromegaly. There is no neck lymphadenopathy. Cardiovascular: RRR, no m/r/g. No LE edema. Telemetry: SR, no arrhythmias  Respiratory: CTA bilaterally, no w/r/r. Normal respiratory effort. Abdomen: soft, ntnd Skin: no rash or induration seen on limited exam Musculoskeletal: grossly normal tone BUE/BLE Psychiatric: grossly normal mood and affect, speech fluent and appropriate Neurologic: grossly non-focal.          Labs on  Admission:  Basic Metabolic Panel:  Recent Labs Lab 08/06/14 1340  NA 138  K 3.9  CL 104  CO2 25  GLUCOSE 92  BUN 18  CREATININE 1.28*  CALCIUM 9.0   Liver Function Tests: No results for input(s): AST, ALT, ALKPHOS, BILITOT, PROT, ALBUMIN in the last 168 hours. No results for input(s): LIPASE, AMYLASE in the last 168 hours. No results for input(s): AMMONIA in the last 168 hours. CBC:  Recent Labs Lab 08/06/14 1340  WBC 21.0*  NEUTROABS 18.0*  HGB 15.2  HCT 44.7  MCV 88.7  PLT 195   Cardiac Enzymes: No results for input(s):  CKTOTAL, CKMB, CKMBINDEX, TROPONINI in the last 168 hours.  BNP (last 3 results) No results for input(s): BNP in the last 8760 hours.  ProBNP (last 3 results) No results for input(s): PROBNP in the last 8760 hours.  CBG: No results for input(s): GLUCAP in the last 168 hours.  Radiological Exams on Admission: Ct Soft Tissue Neck W Contrast  08/06/2014   CLINICAL DATA:  Sore throat for 4 days. The patient is unable to swallow.  EXAM: CT NECK WITH CONTRAST  TECHNIQUE: Multidetector CT imaging of the neck was performed using the standard protocol following the bolus administration of intravenous contrast.  CONTRAST:  75mL OMNIPAQUE IOHEXOL 300 MG/ML  SOLN  COMPARISON:  None.  FINDINGS: Pharynx and larynx: There is edema of the adenoids and in the prevertebral soft tissues extending from C2 to C4-5. The soft palate is prominent. Epiglottis is normal. Lingual tonsils are slightly prominent.  Salivary glands: Normal.  Thyroid: Normal.  Lymph nodes: Small reactive lymph nodes in both sides of the neck.  Vascular: Normal.  Limited intracranial: Normal.  Visualized orbits: Normal.  Mastoids and visualized paranasal sinuses: Normal.  Skeleton: Normal.  Upper chest: Normal.  IMPRESSION: Prominent pharyngitis with enlarged lymph nodes and prevertebral edema without a definable abscess.   Electronically Signed   By: Francene Boyers M.D.   On: 08/06/2014 15:00      Assessment/Plan   1. Dehydration. Because of his infection, he has had poor by mouth intake. He'll be given IV fluids. 2. Streptococcal pharyngitis. Intravenous Rocephin.  Further recommendations will depend on patient's hospital progress.   Code Status: Full code.   DVT Prophylaxis: Lovenox.  Family Communication: I discussed the plan with the patient at the bedside.   Disposition Plan: Home when medically stable, most likely tomorrow morning.   Time spent: 45 minutes.  Wilson Singer [facility] Pager [phone].

## 2014-08-06 NOTE — ED Notes (Signed)
Pt states that he has had a sore throat for the past 3-4 days.

## 2014-08-06 NOTE — ED Provider Notes (Signed)
CSN: [id number]     Arrival date & time 08/06/14  1324 History   First MD Initiated Contact with Patient 08/06/14 1335     Chief Complaint  Patient presents with  . Sore Throat      HPI  Pt was seen at 1350. Per pt, c/o gradual onset and worsening of constant sore throat for the past 3 to 4 days. LD tylenol and motrin last night. Pain worsens with swallowing. Pt has had decreased PO intake due to his pain. Denies choking, no SOB, no stridor/wheezing, no CP/palpitations, no abd pain, no N/V/D, no rash.    Past Medical History  Diagnosis Date  . Medical history non-contributory    Past Surgical History  Procedure Laterality Date  . No past surgeries    . Colonoscopy N/A 02/20/2013    WUJ:WJXBJYSLF:Normal mucosa in the terminal ileum/Normal colon/RECTAL BLEEDING DUE TO Moderate sized internal hemorrhoids  . Hemorrhoid banding N/A 02/20/2013    Procedure: HEMORRHOID BANDING;  Surgeon: West BaliSandi L Fields, MD;  Location: [facility];  Service: Endoscopy;  Laterality: N/A;   Family History  Problem Relation Age of Onset  . Colon cancer Neg Hx   . Colon polyps Neg Hx    History  Substance Use Topics  . Smoking status: Current Some Day Smoker -- 0.50 packs/day    Types: Cigarettes  . Smokeless tobacco: Not on file  . Alcohol Use: No    Review of Systems ROS: Statement: All systems negative except as marked or noted in the HPI; Constitutional: Negative for fever and chills. ; ; Eyes: Negative for eye pain, redness and discharge. ; ; ENMT: Negative for ear pain, hoarseness, nasal congestion, sinus pressure and +sore throat. ; ; Cardiovascular: Negative for chest pain, palpitations, diaphoresis, dyspnea and peripheral edema. ; ; Respiratory: Negative for cough, wheezing and stridor. ; ; Gastrointestinal: Negative for nausea, vomiting, diarrhea, abdominal pain, blood in stool, hematemesis, jaundice and rectal bleeding. . ; ; Genitourinary: Negative for dysuria, flank pain and hematuria. ; ;  Musculoskeletal: Negative for back pain and neck pain. Negative for swelling and trauma.; ; Skin: Negative for pruritus, rash, abrasions, blisters, bruising and skin lesion.; ; Neuro: Negative for headache, lightheadedness and neck stiffness. Negative for weakness, altered level of consciousness , altered mental status, extremity weakness, paresthesias, involuntary movement, seizure and syncope.      Allergies  Review of patient's allergies indicates no known allergies.  Home Medications   Prior to Admission medications   Medication Sig Start Date End Date Taking? Authorizing Provider  amoxicillin (AMOXIL) 500 MG capsule Take 2 capsules (1,000 mg total) by mouth 2 (two) times daily. Patient not taking: Reported on 08/06/2014 05/04/14   Dione Boozeavid Glick, MD  oxyCODONE-acetaminophen (PERCOCET) 5-325 MG per tablet Take 1 tablet by mouth every 4 (four) hours as needed for moderate pain. Patient not taking: Reported on 08/06/2014 05/04/14   Dione Boozeavid Glick, MD  traMADol (ULTRAM) 50 MG tablet Take 1 tablet (50 mg total) by mouth every 6 (six) hours as needed. Patient not taking: Reported on 08/06/2014 05/04/14   Dione Boozeavid Glick, MD   BP 130/80 mmHg  Pulse 97  Temp(Src) 102.1 F (38.9 C) (Oral)  Resp 12  Ht 6\' 3"  (1.905 m)  Wt 160 lb (72.576 kg)  BMI 20.00 kg/m2  SpO2 100% Physical Exam  1355: Physical examination:  Nursing notes reviewed; Vital signs and O2 SAT reviewed;  Constitutional: Well developed, Well nourished, uncomfortable appearing; Head:  Normocephalic, atraumatic; Eyes: EOMI, PERRL, No  scleral icterus; ENMT: TM's clear bilat. Mouth and pharynx without lesions. +posterior pharyngeal erythema. No tonsillar exudates. No intra-oral edema. No submandibular or sublingual edema. +muffled voice, +intermittently spitting into emesis bag at bedside. No drooling, no stridor. No trismus. Mouth and pharynx normal, Mucous membranes dry;; Neck: Supple, Full range of motion, No lymphadenopathy; Cardiovascular: Regular  rate and rhythm, No murmur, rub, or gallop; Respiratory: Breath sounds clear & equal bilaterally, No rales, rhonchi, wheezes.  Speaking full sentences with ease, Normal respiratory effort/excursion; Chest: Nontender, Movement normal; Abdomen: Soft, Nontender, Nondistended, Normal bowel sounds; Genitourinary: No CVA tenderness; Extremities: Pulses normal, No tenderness, No edema, No calf edema or asymmetry.; Neuro: AA&Ox3, Major CN grossly intact.  Speech clear. No gross focal motor or sensory deficits in extremities. Climbs on and off stretcher easily by himself. Gait steady.; Skin: Color normal, Warm, Dry.   ED Course  Procedures     EKG Interpretation None      MDM  MDM Reviewed: previous chart, nursing note and vitals Reviewed previous: labs Interpretation: labs and CT scan     Results for orders placed or performed during the hospital encounter of 08/06/14  Rapid strep screen  Result Value Ref Range   Streptococcus, Group A Screen (Direct) POSITIVE (A) NEGATIVE  CBC with Differential  Result Value Ref Range   WBC 21.0 (H) 4.0 - 10.5 K/uL   RBC 5.04 4.22 - 5.81 MIL/uL   Hemoglobin 15.2 13.0 - 17.0 g/dL   HCT 16.1 09.6 - 04.5 %   MCV 88.7 78.0 - 100.0 fL   MCH 30.2 26.0 - 34.0 pg   MCHC 34.0 30.0 - 36.0 g/dL   RDW 40.9 81.1 - 91.4 %   Platelets 195 150 - 400 K/uL   Neutrophils Relative % 86 (H) 43 - 77 %   Neutro Abs 18.0 (H) 1.7 - 7.7 K/uL   Lymphocytes Relative 4 (L) 12 - 46 %   Lymphs Abs 0.9 0.7 - 4.0 K/uL   Monocytes Relative 10 3 - 12 %   Monocytes Absolute 2.1 (H) 0.1 - 1.0 K/uL   Eosinophils Relative 0 0 - 5 %   Eosinophils Absolute 0.0 0.0 - 0.7 K/uL   Basophils Relative 0 0 - 1 %   Basophils Absolute 0.0 0.0 - 0.1 K/uL  Basic metabolic panel  Result Value Ref Range   Sodium 138 135 - 145 mmol/L   Potassium 3.9 3.5 - 5.1 mmol/L   Chloride 104 101 - 111 mmol/L   CO2 25 22 - 32 mmol/L   Glucose, Bld 92 65 - 99 mg/dL   BUN 18 6 - 20 mg/dL   Creatinine, Ser  7.82 (H) 0.61 - 1.24 mg/dL   Calcium 9.0 8.9 - 95.6 mg/dL   GFR calc non Af Amer >60 >60 mL/min   GFR calc Af Amer >60 >60 mL/min   Anion gap 9 5 - 15   Ct Soft Tissue Neck W Contrast 08/06/2014   CLINICAL DATA:  Sore throat for 4 days. The patient is unable to swallow.  EXAM: CT NECK WITH CONTRAST  TECHNIQUE: Multidetector CT imaging of the neck was performed using the standard protocol following the bolus administration of intravenous contrast.  CONTRAST:  75mL OMNIPAQUE IOHEXOL 300 MG/ML  SOLN  COMPARISON:  None.  FINDINGS: Pharynx and larynx: There is edema of the adenoids and in the prevertebral soft tissues extending from C2 to C4-5. The soft palate is prominent. Epiglottis is normal. Lingual tonsils are slightly prominent.  Salivary glands: Normal.  Thyroid: Normal.  Lymph nodes: Small reactive lymph nodes in both sides of the neck.  Vascular: Normal.  Limited intracranial: Normal.  Visualized orbits: Normal.  Mastoids and visualized paranasal sinuses: Normal.  Skeleton: Normal.  Upper chest: Normal.  IMPRESSION: Prominent pharyngitis with enlarged lymph nodes and prevertebral edema without a definable abscess.   Electronically Signed   By: Francene Boyers M.D.   On: 08/06/2014 15:00    1550:  IV decadron given for pain. APAP and motrin given for fever with good effect. BUN/Cr elevated from baseline; will continue IVF.  CT scan without acute abscess; will tx with IV clindamycin.  T/C to [facility] Dr. Karilyn Cota, case discussed, including:  HPI, pertinent PM/SHx, VS/PE, dx testing, ED course and treatment:  Agreeable to admit, requests to write temporary orders, obtain observation medical bed to team [facility].    Samuel Jester, DO 08/09/14 1209

## 2014-08-07 DIAGNOSIS — A419 Sepsis, unspecified organism: Secondary | ICD-10-CM | POA: Diagnosis present

## 2014-08-07 DIAGNOSIS — Z720 Tobacco use: Secondary | ICD-10-CM | POA: Diagnosis not present

## 2014-08-07 DIAGNOSIS — J020 Streptococcal pharyngitis: Secondary | ICD-10-CM | POA: Diagnosis not present

## 2014-08-07 DIAGNOSIS — A401 Sepsis due to streptococcus, group B: Secondary | ICD-10-CM | POA: Diagnosis not present

## 2014-08-07 DIAGNOSIS — E860 Dehydration: Secondary | ICD-10-CM | POA: Diagnosis present

## 2014-08-07 DIAGNOSIS — F17210 Nicotine dependence, cigarettes, uncomplicated: Secondary | ICD-10-CM | POA: Diagnosis present

## 2014-08-07 DIAGNOSIS — A400 Sepsis due to streptococcus, group A: Secondary | ICD-10-CM

## 2014-08-07 DIAGNOSIS — R1110 Vomiting, unspecified: Secondary | ICD-10-CM | POA: Diagnosis present

## 2014-08-07 DIAGNOSIS — R509 Fever, unspecified: Secondary | ICD-10-CM | POA: Diagnosis present

## 2014-08-07 LAB — CBC
HEMATOCRIT: 42.3 % (ref 39.0–52.0)
Hemoglobin: 14.3 g/dL (ref 13.0–17.0)
MCH: 29.9 pg (ref 26.0–34.0)
MCHC: 33.8 g/dL (ref 30.0–36.0)
MCV: 88.3 fL (ref 78.0–100.0)
Platelets: 201 10*3/uL (ref 150–400)
RBC: 4.79 MIL/uL (ref 4.22–5.81)
RDW: 13.4 % (ref 11.5–15.5)
WBC: 21.1 10*3/uL — AB (ref 4.0–10.5)

## 2014-08-07 LAB — COMPREHENSIVE METABOLIC PANEL
ALBUMIN: 3.9 g/dL (ref 3.5–5.0)
ALK PHOS: 65 U/L (ref 38–126)
ALT: 16 U/L — AB (ref 17–63)
ANION GAP: 6 (ref 5–15)
AST: 20 U/L (ref 15–41)
BILIRUBIN TOTAL: 0.9 mg/dL (ref 0.3–1.2)
BUN: 19 mg/dL (ref 6–20)
CO2: 26 mmol/L (ref 22–32)
CREATININE: 0.9 mg/dL (ref 0.61–1.24)
Calcium: 9 mg/dL (ref 8.9–10.3)
Chloride: 106 mmol/L (ref 101–111)
GFR calc Af Amer: >60 mL/min (ref >60)
GFR calc non Af Amer: >60 mL/min (ref >60)
GLUCOSE: 108 mg/dL — AB (ref 65–99)
POTASSIUM: 4.5 mmol/L (ref 3.5–5.1)
Sodium: 138 mmol/L (ref 135–145)
TOTAL PROTEIN: 7.5 g/dL (ref 6.5–8.1)

## 2014-08-07 MED ORDER — MORPHINE SULFATE 2 MG/ML IJ SOLN
0.5000 mg | Freq: Once | INTRAMUSCULAR | Status: AC
  Administered 2014-08-07: 0.5 mg via INTRAVENOUS
  Filled 2014-08-07: qty 1

## 2014-08-07 MED ORDER — PHENOL 1.4 % MT LIQD
1.0000 | OROMUCOSAL | Status: DC | PRN
  Administered 2014-08-07: 1 via OROMUCOSAL
  Filled 2014-08-07: qty 177

## 2014-08-07 MED ORDER — OXYCODONE HCL 5 MG/5ML PO SOLN
5.0000 mg | ORAL | Status: DC | PRN
  Administered 2014-08-07 (×2): 5 mg via ORAL
  Filled 2014-08-07 (×2): qty 5

## 2014-08-07 MED ORDER — LIDOCAINE VISCOUS 2 % MT SOLN
15.0000 mL | OROMUCOSAL | Status: DC | PRN
  Administered 2014-08-07 (×2): 15 mL via OROMUCOSAL
  Filled 2014-08-07 (×2): qty 15

## 2014-08-07 MED ORDER — ACETAMINOPHEN 160 MG/5ML PO SOLN: 325.0000 mg | ORAL | Status: DC | PRN

## 2014-08-07 NOTE — Progress Notes (Signed)
Patient Demographics  Nathaniel Sparks, is a 19 y.o. male, DOB - 07-Apr-1995, XBJ:[id number]  Admit date - 08/06/2014   Admitting Physician Wilson SingerNimish C Gosrani, MD  Outpatient Primary MD for the patient is No primary care provider on file.  LOS -    Chief Complaint  Patient presents with  . Sore Throat       Admission HPI/Brief narrative: This is a 19 year old man presents with worsening sore throat associated with poor by mouth intake. Fever of 102.1 and leukocytosis of 21 tablet.  Subjective:   Nathaniel Sparks today has, No headache, No chest pain, No abdominal pain - No Nausea, No Cough - SOB, reports odynophagia, sore throat.  Assessment & Plan    Active Problems:   Strep pharyngitis   Dehydration   Streptococcal pharyngitis  sepsis secondary to streptococcal pharyngitis - Patient presents with fever, leukocytosis, CT soft neck tissue without evidence of abscess. - Opinion with IV Rocephin . - Opinion with IV fluids - On liquid pain medications, to clear liquid diet, on Chloraseptic Spray, Viscous Lidocaine.  Dehydration - Continue with IV fluid, this is secondary to poor oral intake from his pharyngitis.  Code Status: Full  Family Communication: Discussed with patient  Disposition Plan: home when stable   Procedures  none   Consults   none   Medications  Scheduled Meds: . sodium chloride   Intravenous STAT  . cefTRIAXone (ROCEPHIN)  IV  1 g Intravenous Q24H  . enoxaparin (LOVENOX) injection  40 mg Subcutaneous Q24H   Continuous Infusions: . sodium chloride 125 mL/hr at 08/06/14 2010   PRN Meds:.oxyCODONE **AND** acetaminophen, lidocaine, ondansetron **OR** ondansetron (ZOFRAN) IV, phenol  DVT Prophylaxis  Lovenox -  Lab Results  Component Value Date   PLT 201 08/07/2014    Antibiotics    Anti-infectives    Start     Dose/Rate Route Frequency Ordered Stop   08/06/14 1645  cefTRIAXone (ROCEPHIN) 1 g in dextrose 5 % 50 mL IVPB     1 g 100 mL/hr over 30 Minutes Intravenous Every 24 hours 08/06/14 1640     08/06/14 1515  clindamycin (CLEOCIN) IVPB 900 mg     900 mg 100 mL/hr over 30 Minutes Intravenous  Once 08/06/14 1508 08/06/14 1555          Objective:   Filed Vitals:   08/06/14 1848 08/06/14 1956 08/06/14 2232 08/07/14 0621  BP: 126/80 123/71 140/93 136/83  Pulse: 73 71 89 76  Temp:  97.5 F (36.4 C) 98.6 F (37 C) 98.2 F (36.8 C)  TempSrc:  Oral Oral Oral  Resp: 17 18 18 18   Height:    6\' 3"  (1.905 m)  Weight:    70.625 kg (155 lb 11.2 oz)  SpO2: 100% 100% 100% 100%    Wt Readings from Last 3 Encounters:  08/07/14 70.625 kg (155 lb 11.2 oz)  05/04/14 72.576 kg (160 lb)  05/04/14 72.576 kg (160 lb)     Intake/Output Summary (Last 24 hours) at 08/07/14 1120 Last data filed at 08/07/14 0700  Gross per 24 hour  Intake   1795 ml  Output      0 ml  Net   1795 ml     Physical Exam  Awake Alert, Oriented X 3, No new F.N deficits, Normal affect erythema and inflammation in back of his throat Symmetrical Chest wall movement, Good air movement bilaterally, CTAB RRR,No Gallops,Rubs or new Murmurs, No Parasternal Heave +ve B.Sounds, Abd Soft, No tenderness, No organomegaly appriciated, No rebound - guarding or rigidity. No Cyanosis, Clubbing or edema, No new Rash or bruise     Data Review   Micro Results Recent Results (from the past 240 hour(s))  Rapid strep screen     Status: Abnormal   Collection Time: 08/06/14  1:40 PM  Result Value Ref Range Status   Streptococcus, Group A Screen (Direct) POSITIVE (A) NEGATIVE Final    Radiology Reports Ct Soft Tissue Neck W Contrast  08/06/2014   CLINICAL DATA:  Sore throat for 4 days. The patient is unable to swallow.  EXAM: CT NECK WITH CONTRAST  TECHNIQUE: Multidetector CT imaging of the neck was performed using the standard protocol following the bolus administration of  intravenous contrast.  CONTRAST:  75mL OMNIPAQUE IOHEXOL 300 MG/ML  SOLN  COMPARISON:  None.  FINDINGS: Pharynx and larynx: There is edema of the adenoids and in the prevertebral soft tissues extending from C2 to C4-5. The soft palate is prominent. Epiglottis is normal. Lingual tonsils are slightly prominent.  Salivary glands: Normal.  Thyroid: Normal.  Lymph nodes: Small reactive lymph nodes in both sides of the neck.  Vascular: Normal.  Limited intracranial: Normal.  Visualized orbits: Normal.  Mastoids and visualized paranasal sinuses: Normal.  Skeleton: Normal.  Upper chest: Normal.  IMPRESSION: Prominent pharyngitis with enlarged lymph nodes and prevertebral edema without a definable abscess.   Electronically Signed   By: Francene Boyers M.D.   On: 08/06/2014 15:00     CBC  Recent Labs Lab 08/06/14 1340 08/07/14 0603  WBC 21.0* 21.1*  HGB 15.2 14.3  HCT 44.7 42.3  PLT 195 201  MCV 88.7 88.3  MCH 30.2 29.9  MCHC 34.0 33.8  RDW 13.4 13.4  LYMPHSABS 0.9  --   MONOABS 2.1*  --   EOSABS 0.0  --   BASOSABS 0.0  --     Chemistries   Recent Labs Lab 08/06/14 1340 08/07/14 0603  NA 138 138  K 3.9 4.5  CL 104 106  CO2 25 26  GLUCOSE 92 108*  BUN 18 19  CREATININE 1.28* 0.90  CALCIUM 9.0 9.0  AST  --  20  ALT  --  16*  ALKPHOS  --  65  BILITOT  --  0.9   ------------------------------------------------------------------------------------------------------------------ estimated creatinine clearance is 129.7 mL/min (by C-G formula based on Cr of 0.9). ------------------------------------------------------------------------------------------------------------------ No results for input(s): HGBA1C in the last 72 hours. ------------------------------------------------------------------------------------------------------------------ No results for input(s): CHOL, HDL, LDLCALC, TRIG, CHOLHDL, LDLDIRECT in the last 72  hours. ------------------------------------------------------------------------------------------------------------------ No results for input(s): TSH, T4TOTAL, T3FREE, THYROIDAB in the last 72 hours.  Invalid input(s): FREET3 ------------------------------------------------------------------------------------------------------------------ No results for input(s): VITAMINB12, FOLATE, FERRITIN, TIBC, IRON, RETICCTPCT in the last 72 hours.  Coagulation profile No results for input(s): INR, PROTIME in the last 168 hours.  No results for input(s): DDIMER in the last 72 hours.  Cardiac Enzymes No results for input(s): CKMB, TROPONINI, MYOGLOBIN in the last 168 hours.  Invalid input(s): CK ------------------------------------------------------------------------------------------------------------------ Invalid input(s): POCBNP     Time Spent in minutes   25 minutes   Nathaniel Sparks M.D on 08/07/2014 at 11:20 AM  Between 7am to 7pm - Pager - [phone]  After 7pm go to [email or web address] - password [facility]  [facility]   Office  [phone]

## 2014-08-07 NOTE — Care Management Note (Signed)
Case Management Note  Patient Details  Name: Nathaniel Sparks MRN: [id number] Date of Birth: [birth year]  Subjective/Objective:                  Pt admitted from home with strep pharyngitis. Pt lives with his parents and will return home at discharge. Pt is independent with ADL's.  Action/Plan: No Cm needs noted.  Expected Discharge Date:                  Expected Discharge Plan:  Home/Self Care  In-House Referral:  NA  Discharge planning Services  CM Consult  Post Acute Care Choice:  NA Choice offered to:  NA  DME Arranged:    DME Agency:     HH Arranged:    HH Agency:     Status of Service:  Completed, signed off  Medicare Important Message Given:    Date Medicare IM Given:    Medicare IM give by:    Date Additional Medicare IM Given:    Additional Medicare Important Message give by:     If discussed at Long Length of Stay Meetings, dates discussed:    Additional Comments:  Nathaniel Sparks, Nathaniel Mentink Crowder, RN 08/07/2014, 12:47 PM

## 2014-08-07 NOTE — Progress Notes (Signed)
Patient c/o sore throat. Contacted MD to request pain medication. Order received for one time dose of 0.5mg  of morphine. Will continue to monitor.

## 2014-08-08 ENCOUNTER — Emergency Department (HOSPITAL_COMMUNITY)
Admission: EM | Admit: 2014-08-08 | Discharge: 2014-08-08 | Disposition: A | Discharge status: Home / Self Care | Payer: BLUE CROSS/BLUE SHIELD | Attending: Emergency Medicine | Admitting: Emergency Medicine

## 2014-08-08 ENCOUNTER — Encounter (HOSPITAL_COMMUNITY): Payer: Self-pay | Admitting: Emergency Medicine

## 2014-08-08 DIAGNOSIS — E860 Dehydration: Secondary | ICD-10-CM | POA: Diagnosis not present

## 2014-08-08 DIAGNOSIS — Z720 Tobacco use: Secondary | ICD-10-CM | POA: Insufficient documentation

## 2014-08-08 LAB — BASIC METABOLIC PANEL
Anion gap: 7 (ref 5–15)
BUN: 15 mg/dL (ref 6–20)
CO2: 26 mmol/L (ref 22–32)
Calcium: 8.8 mg/dL — ABNORMAL LOW (ref 8.9–10.3)
Chloride: 107 mmol/L (ref 101–111)
Creatinine, Ser: 0.95 mg/dL (ref 0.61–1.24)
GFR calc Af Amer: >60 mL/min (ref >60)
GFR calc non Af Amer: >60 mL/min (ref >60)
GLUCOSE: 87 mg/dL (ref 65–99)
POTASSIUM: 3.6 mmol/L (ref 3.5–5.1)
Sodium: 140 mmol/L (ref 135–145)

## 2014-08-08 LAB — CBC
HCT: 39.6 % (ref 39.0–52.0)
Hemoglobin: 13.2 g/dL (ref 13.0–17.0)
MCH: 29.4 pg (ref 26.0–34.0)
MCHC: 33.3 g/dL (ref 30.0–36.0)
MCV: 88.2 fL (ref 78.0–100.0)
Platelets: 186 10*3/uL (ref 150–400)
RBC: 4.49 MIL/uL (ref 4.22–5.81)
RDW: 13.3 % (ref 11.5–15.5)
WBC: 12.8 10*3/uL — AB (ref 4.0–10.5)

## 2014-08-08 MED ORDER — ONDANSETRON HCL 4 MG/2ML IJ SOLN
4.0000 mg | Freq: Once | INTRAMUSCULAR | Status: AC
  Administered 2014-08-08: 4 mg via INTRAVENOUS
  Filled 2014-08-08: qty 2

## 2014-08-08 MED ORDER — SODIUM CHLORIDE 0.9 % IV BOLUS (SEPSIS)
1000.0000 mL | Freq: Once | INTRAVENOUS | Status: AC
  Administered 2014-08-08: 1000 mL via INTRAVENOUS

## 2014-08-08 MED ORDER — SODIUM CHLORIDE 0.9 % IV BOLUS (SEPSIS): 1000.0000 mL | Freq: Once | INTRAVENOUS | Status: DC

## 2014-08-08 MED ORDER — DEXAMETHASONE SODIUM PHOSPHATE 4 MG/ML IJ SOLN
8.0000 mg | Freq: Once | INTRAMUSCULAR | Status: AC
  Administered 2014-08-08: 8 mg via INTRAVENOUS
  Filled 2014-08-08: qty 2

## 2014-08-08 MED ORDER — AMOXICILLIN 500 MG PO CAPS: 500.0000 mg | ORAL_CAPSULE | Freq: Three times a day (TID) | ORAL | Status: DC

## 2014-08-08 MED ORDER — PREDNISONE 50 MG PO TABS: ORAL_TABLET | ORAL | Status: DC

## 2014-08-08 MED ORDER — PROMETHAZINE HCL 25 MG PO TABS: 25.0000 mg | ORAL_TABLET | Freq: Four times a day (QID) | ORAL | Status: DC | PRN

## 2014-08-08 NOTE — Discharge Instructions (Signed)
Increase fluids. Prescription for antibiotic, nausea medication, prednisone. Stay out of the heat

## 2014-08-08 NOTE — ED Notes (Addendum)
Pt reports emesis and generalized weakness. Pt was admitted upstairs and left AMA this am. Pt reports "i still cant keep anything down."

## 2014-08-08 NOTE — ED Provider Notes (Signed)
CSN: [id number]     Arrival date & time 08/08/14  1348 History   First MD Initiated Contact with Patient 08/08/14 1509     Chief Complaint  Patient presents with  . Emesis     (Consider location/radiation/quality/duration/timing/severity/associated sxs/prior Treatment) HPI.... Patient admitted to the hospital on 08/06/2014 for strep pharyngitis. He discharged himself AMA this morning. He has not been keeping fluids down and feels dehydrated. No fever, sweats, chills. He is normally healthy. Severity is mild to moderate. No stiff neck, neurodeficits, difficulty swallowing  Past Medical History  Diagnosis Date  . Medical history non-contributory    Past Surgical History  Procedure Laterality Date  . No past surgeries    . Colonoscopy N/A 02/20/2013    WUJ:WJXBJYSLF:Normal mucosa in the terminal ileum/Normal colon/RECTAL BLEEDING DUE TO Moderate sized internal hemorrhoids  . Hemorrhoid banding N/A 02/20/2013    Procedure: HEMORRHOID BANDING;  Surgeon: West BaliSandi L Fields, MD;  Location: [facility];  Service: Endoscopy;  Laterality: N/A;   Family History  Problem Relation Age of Onset  . Colon cancer Neg Hx   . Colon polyps Neg Hx    History  Substance Use Topics  . Smoking status: Current Some Day Smoker -- 0.50 packs/day    Types: Cigarettes  . Smokeless tobacco: Not on file  . Alcohol Use: No    Review of Systems  All other systems reviewed and are negative.     Allergies  Review of patient's allergies indicates no known allergies.  Home Medications   Prior to Admission medications   Medication Sig Start Date End Date Taking? Authorizing Provider  phenol (CHLORASEPTIC) 1.4 % LIQD Use as directed 3 sprays in the mouth or throat as needed for throat irritation / pain.   Yes Historical Provider, MD  amoxicillin (AMOXIL) 500 MG capsule Take 1 capsule (500 mg total) by mouth 3 (three) times daily. 08/08/14   Donnetta HutchingBrian Monnica Saltsman, MD  oxyCODONE-acetaminophen (PERCOCET) 5-325 MG per tablet Take 1  tablet by mouth every 4 (four) hours as needed for moderate pain. Patient not taking: Reported on 08/06/2014 05/04/14   Dione Boozeavid Glick, MD  predniSONE (DELTASONE) 50 MG tablet 1 tablet for 7 days 08/08/14   Donnetta HutchingBrian Sheyann Sulton, MD  promethazine (PHENERGAN) 25 MG tablet Take 1 tablet (25 mg total) by mouth every 6 (six) hours as needed. 08/08/14   Donnetta HutchingBrian Naziah Weckerly, MD  traMADol (ULTRAM) 50 MG tablet Take 1 tablet (50 mg total) by mouth every 6 (six) hours as needed. Patient not taking: Reported on 08/06/2014 05/04/14   Dione Boozeavid Glick, MD   BP 135/74 mmHg  Pulse 88  Temp(Src) 98.7 F (37.1 C) (Oral)  Resp 20  Ht 6\' 3"  (1.905 m)  Wt 155 lb (70.308 kg)  BMI 19.37 kg/m2  SpO2 99% Physical Exam  Constitutional: He is oriented to person, place, and time.  Slightly dehydrated  HENT:  Head: Normocephalic and atraumatic.  Oral pharyngeal area erythematous  Eyes: Conjunctivae and EOM are normal. Pupils are equal, round, and reactive to light.  Neck: Normal range of motion. Neck supple.  Supple  Cardiovascular: Normal rate and regular rhythm.   Pulmonary/Chest: Effort normal and breath sounds normal.  Abdominal: Soft. Bowel sounds are normal.  Musculoskeletal: Normal range of motion.  Neurological: He is alert and oriented to person, place, and time.  Skin: Skin is warm and dry.  Psychiatric: He has a normal mood and affect. His behavior is normal.  Nursing note and vitals reviewed.   ED Course  Procedures (including critical care time) Labs Review Labs Reviewed - No data to display  Imaging Review No results found.   EKG Interpretation None      MDM   Final diagnoses:  Dehydration    No evidence of meningitis. Patient feels better after 2 L of IV fluids, IV Zofran, IV Decadron. Discharge medication amoxicillin 500 mg 3 times a day, prednisone 50 mg daily    Donnetta Hutching, MD 08/08/14 1820

## 2014-08-08 NOTE — Progress Notes (Signed)
Patient left AMA. Doctor Selena BattenKim notified and aware.

## 2014-08-08 NOTE — Progress Notes (Signed)
Pt called nurse to room stating that he needed to leave. Advised pt that he needed to wait to see the MD and be discharged. Pt stated that he was not waiting for the MD and that he need to leave now. IV removed. Pt signed AMA paper work.

## 2014-08-28 DIAGNOSIS — A400 Sepsis due to streptococcus, group A: Secondary | ICD-10-CM

## 2014-08-28 NOTE — Discharge Summary (Signed)
Please review progress note 7/12 , patient left AMA on 7/13 as per RN documentation. Huey Bienenstock MD

## 2014-09-18 ENCOUNTER — Encounter (HOSPITAL_COMMUNITY): Payer: Self-pay | Admitting: *Deleted

## 2014-09-18 ENCOUNTER — Emergency Department (HOSPITAL_COMMUNITY)
Admission: EM | Admit: 2014-09-18 | Discharge: 2014-09-18 | Disposition: A | Discharge status: Home / Self Care | Payer: BLUE CROSS/BLUE SHIELD | Attending: Emergency Medicine | Admitting: Emergency Medicine

## 2014-09-18 ENCOUNTER — Emergency Department (HOSPITAL_COMMUNITY): Payer: BLUE CROSS/BLUE SHIELD

## 2014-09-18 DIAGNOSIS — Y9389 Activity, other specified: Secondary | ICD-10-CM | POA: Diagnosis not present

## 2014-09-18 DIAGNOSIS — Y9289 Other specified places as the place of occurrence of the external cause: Secondary | ICD-10-CM | POA: Insufficient documentation

## 2014-09-18 DIAGNOSIS — S6992XA Unspecified injury of left wrist, hand and finger(s), initial encounter: Secondary | ICD-10-CM | POA: Diagnosis present

## 2014-09-18 DIAGNOSIS — S60222A Contusion of left hand, initial encounter: Secondary | ICD-10-CM | POA: Diagnosis not present

## 2014-09-18 DIAGNOSIS — Y998 Other external cause status: Secondary | ICD-10-CM | POA: Diagnosis not present

## 2014-09-18 DIAGNOSIS — Z720 Tobacco use: Secondary | ICD-10-CM | POA: Diagnosis not present

## 2014-09-18 MED ORDER — IBUPROFEN 800 MG PO TABS: 800.0000 mg | ORAL_TABLET | Freq: Three times a day (TID) | ORAL | Status: DC
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
## 2014-09-18 NOTE — Discharge Instructions (Signed)
Contusion °A contusion is a deep bruise. Contusions are the result of an injury that caused bleeding under the skin. The contusion may turn blue, purple, or yellow. Minor injuries will give you a painless contusion, but more severe contusions may stay painful and swollen for a few weeks.  °CAUSES  °A contusion is usually caused by a blow, trauma, or direct force to an area of the body. °SYMPTOMS  °· Swelling and redness of the injured area. °· Bruising of the injured area. °· Tenderness and soreness of the injured area. °· Pain. °DIAGNOSIS  °The diagnosis can be made by taking a history and physical exam. An X-ray, CT scan, or MRI may be needed to determine if there were any associated injuries, such as fractures. °TREATMENT  °Specific treatment will depend on what area of the body was injured. In general, the best treatment for a contusion is resting, icing, elevating, and applying cold compresses to the injured area. Over-the-counter medicines may also be recommended for pain control. Ask your caregiver what the best treatment is for your contusion. °HOME CARE INSTRUCTIONS  °· Put ice on the injured area. °¨ Put ice in a plastic bag. °¨ Place a towel between your skin and the bag. °¨ Leave the ice on for 15-20 minutes, 3-4 times a day, or as directed by your health care provider. °· Only take over-the-counter or prescription medicines for pain, discomfort, or fever as directed by your caregiver. Your caregiver may recommend avoiding anti-inflammatory medicines (aspirin, ibuprofen, and naproxen) for 48 hours because these medicines may increase bruising. °· Rest the injured area. °· If possible, elevate the injured area to reduce swelling. °SEEK IMMEDIATE MEDICAL CARE IF:  °· You have increased bruising or swelling. °· You have pain that is getting worse. °· Your swelling or pain is not relieved with medicines. °MAKE SURE YOU:  °· Understand these instructions. °· Will watch your condition. °· Will get help right  away if you are not doing well or get worse. °Document Released: 10/22/2004 Document Revised: 01/17/2013 Document Reviewed: 11/17/2010 °ExitCare® Patient Information ©2015 [employer]. This information is not intended to replace advice given to you by your health care provider. Make sure you discuss any questions you have with your health care provider. ° °
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
## 2014-11-02 ENCOUNTER — Emergency Department (HOSPITAL_COMMUNITY)
Admission: EM | Admit: 2014-11-02 | Discharge: 2014-11-02 | Disposition: A | Discharge status: Home / Self Care | Payer: BLUE CROSS/BLUE SHIELD | Attending: Emergency Medicine | Admitting: Emergency Medicine

## 2014-11-02 ENCOUNTER — Encounter (HOSPITAL_COMMUNITY): Payer: Self-pay

## 2014-11-02 DIAGNOSIS — Z720 Tobacco use: Secondary | ICD-10-CM | POA: Diagnosis not present

## 2014-11-02 DIAGNOSIS — Z113 Encounter for screening for infections with a predominantly sexual mode of transmission: Secondary | ICD-10-CM | POA: Insufficient documentation

## 2014-11-02 DIAGNOSIS — Z711 Person with feared health complaint in whom no diagnosis is made: Secondary | ICD-10-CM

## 2014-11-02 DIAGNOSIS — Z791 Long term (current) use of non-steroidal anti-inflammatories (NSAID): Secondary | ICD-10-CM | POA: Diagnosis not present

## 2014-11-02 LAB — URINE MICROSCOPIC-ADD ON

## 2014-11-02 LAB — URINALYSIS, ROUTINE W REFLEX MICROSCOPIC
Bilirubin Urine: NEGATIVE
GLUCOSE, UA: NEGATIVE mg/dL
Ketones, ur: NEGATIVE mg/dL
LEUKOCYTES UA: NEGATIVE
Nitrite: NEGATIVE
PH: 6.5 (ref 5.0–8.0)
Protein, ur: NEGATIVE mg/dL
Specific Gravity, Urine: 1.025 (ref 1.005–1.030)
Urobilinogen, UA: 0.2 mg/dL (ref 0.0–1.0)

## 2014-11-02 MED ORDER — CEFTRIAXONE SODIUM 250 MG IJ SOLR
250.0000 mg | Freq: Once | INTRAMUSCULAR | Status: AC
  Administered 2014-11-02: 250 mg via INTRAMUSCULAR
  Filled 2014-11-02: qty 250

## 2014-11-02 MED ORDER — STERILE WATER FOR INJECTION IJ SOLN
INTRAMUSCULAR | Status: AC
  Administered 2014-11-02: 10 mL
  Filled 2014-11-02: qty 10

## 2014-11-02 MED ORDER — METRONIDAZOLE 500 MG PO TABS
2000.0000 mg | ORAL_TABLET | Freq: Once | ORAL | Status: AC
  Administered 2014-11-02: 2000 mg via ORAL
  Filled 2014-11-02: qty 4

## 2014-11-02 MED ORDER — AZITHROMYCIN 250 MG PO TABS
1000.0000 mg | ORAL_TABLET | Freq: Once | ORAL | Status: AC
  Administered 2014-11-02: 1000 mg via ORAL
  Filled 2014-11-02: qty 4

## 2014-11-02 NOTE — ED Provider Notes (Signed)
CSN: [id number]     Arrival date & time 11/02/14  0319 History   First MD Initiated Contact with Patient 11/02/14 [phone]     Chief Complaint  Patient presents with  . S74.5      (Consider location/radiation/quality/duration/timing/severity/associated sxs/prior Treatment) HPI  This is a 19 year old male who presents with concerns for STDs. Patient reports that his girlfriend was seen earlier today and diagnosed with trichomonas. Patient is currently asymptomatic. He states that she is his only current partner. They do not use condoms. No history of STDs.  Past Medical History  Diagnosis Date  . Medical history non-contributory    Past Surgical History  Procedure Laterality Date  . No past surgeries    . Colonoscopy N/A 02/20/2013    HQI:ONGEXB mucosa in the terminal ileum/Normal colon/RECTAL BLEEDING DUE TO Moderate sized internal hemorrhoids  . Hemorrhoid banding N/A 02/20/2013    Procedure: HEMORRHOID BANDING;  Surgeon: West Bali, MD;  Location: [facility];  Service: Endoscopy;  Laterality: N/A;   Family History  Problem Relation Age of Onset  . Colon cancer Neg Hx   . Colon polyps Neg Hx    Social History  Substance Use Topics  . Smoking status: Current Some Day Smoker -- 0.50 packs/day    Types: Cigarettes  . Smokeless tobacco: None  . Alcohol Use: No    Review of Systems  Genitourinary: Negative for dysuria and discharge.  All other systems reviewed and are negative.     Allergies  Review of patient's allergies indicates no known allergies.  Home Medications   Prior to Admission medications   Medication Sig Start Date End Date Taking? Authorizing Provider  amoxicillin (AMOXIL) 500 MG capsule Take 1 capsule (500 mg total) by mouth 3 (three) times daily. Patient not taking: Reported on 09/18/2014 08/08/14   Donnetta Hutching, MD  ibuprofen (ADVIL,MOTRIN) 800 MG tablet Take 1 tablet (800 mg total) by mouth 3 (three) times daily. 09/18/14   Elson Areas, PA-C   oxyCODONE-acetaminophen (PERCOCET) 5-325 MG per tablet Take 1 tablet by mouth every 4 (four) hours as needed for moderate pain. Patient not taking: Reported on 08/06/2014 05/04/14   Dione Booze, MD  predniSONE (DELTASONE) 50 MG tablet 1 tablet for 7 days Patient not taking: Reported on 09/18/2014 08/08/14   Donnetta Hutching, MD  promethazine (PHENERGAN) 25 MG tablet Take 1 tablet (25 mg total) by mouth every 6 (six) hours as needed. Patient not taking: Reported on 09/18/2014 08/08/14   Donnetta Hutching, MD  traMADol (ULTRAM) 50 MG tablet Take 1 tablet (50 mg total) by mouth every 6 (six) hours as needed. Patient not taking: Reported on 08/06/2014 05/04/14   Dione Booze, MD   BP 115/76 mmHg  Pulse 48  Temp(Src) 97.8 F (36.6 C) (Oral)  Resp 18  Ht  (1.88 m)  Wt 152 lb (68.947 kg)  BMI 19.51 kg/m2  SpO2 98% Physical Exam  Constitutional: He is oriented to person, place, and time. He appears well-developed and well-nourished.  HENT:  Head: Normocephalic and atraumatic.  Cardiovascular: Normal rate and regular rhythm.   Pulmonary/Chest: Effort normal. No respiratory distress.  Neurological: He is alert and oriented to person, place, and time.  Skin: Skin is warm and dry.  Psychiatric: He has a normal mood and affect.  Nursing note and vitals reviewed.   ED Course  Procedures (including critical care time) Labs Review Labs Reviewed  URINALYSIS, ROUTINE W REFLEX MICROSCOPIC (NOT AT [facility]) - Abnormal; Notable for  the following:    Hgb urine dipstick TRACE (*)    All other components within normal limits  URINE MICROSCOPIC-ADD ON - Abnormal; Notable for the following:    Bacteria, UA FEW (*)    All other components within normal limits  RPR  HIV ANTIBODY (ROUTINE TESTING)  GC/CHLAMYDIA PROBE AMP () NOT AT [facility]    Imaging Review No results found. I have personally reviewed and evaluated these images and lab results as part of my medical decision-making.   EKG Interpretation None       MDM   Final diagnoses:  Concern about STD in male without diagnosis   Patient presents with concerns for STDs. He is asymptomatic. Patient was tested. Urinalysis pending. Patient was given Rocephin, azithromycin, and metronidazole for empiric coverage.  Patient advised to abstain from sexual activity for the next 10 days.  After history, exam, and medical workup I feel the patient has been appropriately medically screened and is safe for discharge home. Pertinent diagnoses were discussed with the patient. Patient was given return precautions.     Shon Baton, MD 11/02/14 [phone]

## 2014-11-02 NOTE — ED Notes (Signed)
Patient verbalizes understanding of discharge instructions, home care and follow up care. Patient ambulatory out of department at this time. 

## 2014-11-02 NOTE — Discharge Instructions (Signed)
You were seen today with concerns for STDs. You were treated. STD testing is pending. You should abstain from sexual activity for the next 10 days. You should then use condoms with sexual intercourse.  Sexually Transmitted Disease A sexually transmitted disease (STD) is a disease or infection that may be passed (transmitted) from person to person, usually during sexual activity. This may happen by way of saliva, semen, blood, vaginal mucus, or urine. Common STDs include:  Gonorrhea.  Chlamydia.  Syphilis.  HIV and AIDS.  Genital herpes.  Hepatitis B and C.  Trichomonas.  Human papillomavirus (HPV).  Pubic lice.  Scabies.  Mites.  Bacterial vaginosis. WHAT ARE CAUSES OF STDs? An STD may be caused by bacteria, a virus, or parasites. STDs are often transmitted during sexual activity if one person is infected. However, they may also be transmitted through nonsexual means. STDs may be transmitted after:   Sexual intercourse with an infected person.  Sharing sex toys with an infected person.  Sharing needles with an infected person or using unclean piercing or tattoo needles.  Having intimate contact with the genitals, mouth, or rectal areas of an infected person.  Exposure to infected fluids during birth. WHAT ARE THE SIGNS AND SYMPTOMS OF STDs? Different STDs have different symptoms. Some people may not have any symptoms. If symptoms are present, they may include:  Painful or bloody urination.  Pain in the pelvis, abdomen, vagina, anus, throat, or eyes.  A skin rash, itching, or irritation.  Growths, ulcerations, blisters, or sores in the genital and anal areas.  Abnormal vaginal discharge with or without bad odor.  Penile discharge in men.  Fever.  Pain or bleeding during sexual intercourse.  Swollen glands in the groin area.  Yellow skin and eyes (jaundice). This is seen with hepatitis.  Swollen testicles.  Infertility.  Sores and blisters in the  mouth. HOW ARE STDs DIAGNOSED? To make a diagnosis, your health care provider may:  Take a medical history.  Perform a physical exam.  Take a sample of any discharge to examine.  Swab the throat, cervix, opening to the penis, rectum, or vagina for testing.  Test a sample of your first morning urine.  Perform blood tests.  Perform a Pap test, if this applies.  Perform a colposcopy.  Perform a laparoscopy. HOW ARE STDs TREATED? Treatment depends on the STD. Some STDs may be treated but not cured.  Chlamydia, gonorrhea, trichomonas, and syphilis can be cured with antibiotic medicine.  Genital herpes, hepatitis, and HIV can be treated, but not cured, with prescribed medicines. The medicines lessen symptoms.  Genital warts from HPV can be treated with medicine or by freezing, burning (electrocautery), or surgery. Warts may come back.  HPV cannot be cured with medicine or surgery. However, abnormal areas may be removed from the cervix, vagina, or vulva.  If your diagnosis is confirmed, your recent sexual partners need treatment. This is true even if they are symptom-free or have a negative culture or evaluation. They should not have sex until their health care providers say it is okay.  Your health care provider may test you for infection again 3 months after treatment. HOW CAN I REDUCE MY RISK OF GETTING AN STD? Take these steps to reduce your risk of getting an STD:  Use latex condoms, dental dams, and water-soluble lubricants during sexual activity. Do not use petroleum jelly or oils.  Avoid having multiple sex partners.  Do not have sex with someone who has other sex partners  Do not have sex with anyone you do not know or who is at high risk for an STD.  Avoid risky sex practices that can break your skin.  Do not have sex if you have open sores on your mouth or skin.  Avoid drinking too much alcohol or taking illegal drugs. Alcohol and drugs can affect your judgment  and put you in a vulnerable position.  Avoid engaging in oral and anal sex acts.  Get vaccinated for HPV and hepatitis. If you have not received these vaccines in the past, talk to your health care provider about whether one or both might be right for you.  If you are at risk of being infected with HIV, it is recommended that you take a prescription medicine daily to prevent HIV infection. This is called pre-exposure prophylaxis (PrEP). You are considered at risk if:  You are a man who has sex with other men (MSM).  You are a heterosexual man or woman and are sexually active with more than one partner.  You take drugs by injection.  You are sexually active with a partner who has HIV.  Talk with your health care provider about whether you are at high risk of being infected with HIV. If you choose to begin PrEP, you should first be tested for HIV. You should then be tested every 3 months for as long as you are taking PrEP. WHAT SHOULD I DO IF I THINK I HAVE AN STD?  See your health care provider.  Tell your sexual partner(s). They should be tested and treated for any STDs.  Do not have sex until your health care provider says it is okay. WHEN SHOULD I GET IMMEDIATE MEDICAL CARE? Contact your health care provider right away if:   You have severe abdominal pain.  You are a man and notice swelling or pain in your testicles.  You are a woman and notice swelling or pain in your vagina.   This information is not intended to replace advice given to you by your health care provider. Make sure you discuss any questions you have with your health care provider.   Document Released: 04/04/2002 Document Revised: 02/02/2014 Document Reviewed: 08/02/2012 Elsevier Interactive Patient Education [employer]

## 2014-11-02 NOTE — ED Notes (Signed)
Pt states his girlfriend was seen here and told she had trichomonas and pt should get checked.  Pt denies complaints

## 2014-11-03 LAB — HIV ANTIBODY (ROUTINE TESTING W REFLEX): HIV Screen 4th Generation wRfx: NONREACTIVE

## 2014-11-03 LAB — RPR: RPR Ser Ql: NONREACTIVE

## 2014-11-05 LAB — GC/CHLAMYDIA PROBE AMP (~~LOC~~) NOT AT ARMC
CHLAMYDIA, DNA PROBE: NEGATIVE
Neisseria Gonorrhea: NEGATIVE

## 2015-10-09 ENCOUNTER — Emergency Department (HOSPITAL_COMMUNITY)
Admission: EM | Admit: 2015-10-09 | Discharge: 2015-10-09 | Disposition: A | Discharge status: Home / Self Care | Payer: BLUE CROSS/BLUE SHIELD | Attending: Emergency Medicine | Admitting: Emergency Medicine

## 2015-10-09 ENCOUNTER — Encounter (HOSPITAL_COMMUNITY): Payer: Self-pay | Admitting: *Deleted

## 2015-10-09 DIAGNOSIS — F17210 Nicotine dependence, cigarettes, uncomplicated: Secondary | ICD-10-CM | POA: Diagnosis not present

## 2015-10-09 DIAGNOSIS — R369 Urethral discharge, unspecified: Secondary | ICD-10-CM

## 2015-10-09 DIAGNOSIS — N390 Urinary tract infection, site not specified: Secondary | ICD-10-CM | POA: Diagnosis not present

## 2015-10-09 DIAGNOSIS — R300 Dysuria: Secondary | ICD-10-CM | POA: Diagnosis present

## 2015-10-09 LAB — URINE MICROSCOPIC-ADD ON: Squamous Epithelial / LPF: NONE SEEN

## 2015-10-09 LAB — URINALYSIS, ROUTINE W REFLEX MICROSCOPIC
Bilirubin Urine: NEGATIVE
GLUCOSE, UA: NEGATIVE mg/dL
Ketones, ur: NEGATIVE mg/dL
NITRITE: NEGATIVE
PH: 6 (ref 5.0–8.0)
Specific Gravity, Urine: >1.03 — ABNORMAL HIGH (ref 1.005–1.030)

## 2015-10-09 MED ORDER — CEPHALEXIN 500 MG PO CAPS: 500.0000 mg | ORAL_CAPSULE | Freq: Two times a day (BID) | ORAL | 0 refills | Status: DC

## 2015-10-09 MED ORDER — CEFTRIAXONE SODIUM 250 MG IJ SOLR
250.0000 mg | Freq: Once | INTRAMUSCULAR | Status: AC
  Administered 2015-10-09: 250 mg via INTRAMUSCULAR
  Filled 2015-10-09: qty 250

## 2015-10-09 MED ORDER — STERILE WATER FOR INJECTION IJ SOLN
INTRAMUSCULAR | Status: AC
  Filled 2015-10-09: qty 10

## 2015-10-09 MED ORDER — AZITHROMYCIN 250 MG PO TABS
1000.0000 mg | ORAL_TABLET | Freq: Once | ORAL | Status: AC
  Administered 2015-10-09: 1000 mg via ORAL
  Filled 2015-10-09: qty 4

## 2015-10-09 NOTE — Discharge Instructions (Addendum)
You have been treated for gonorrhea and chlamydia. Your gonorrhea, chlamydia, HIV and syphilis tests are pending. I recommend that your partner be tested for STDs as well and that both of you wait for at least 1 week after treatment before resuming sexual intercourse or until both of you have had negative testing results.   To find a primary care or specialty doctor please call [phone] or [phone] to access "[facility] Find a Doctor Service."  You may also go on the [facility] website at [email or web address]  There are also multiple [facility] and [facility] practices throughout the [place] that are frequently accepting new patients. You may find a clinic that is close to your home and contact them.  [facility] -  [address] [place] [id number] [phone]  [facility] in [place] (also locations in [place] and [place]) -  [address] [place] [address] [phone]  [facility] -  [address] [place] [address] [phone]

## 2015-10-09 NOTE — ED Triage Notes (Signed)
Pt c/o penis pain and drainage; pt states the drainage is clear and c/o painful urination

## 2015-10-09 NOTE — ED Provider Notes (Signed)
TIME SEEN: 5:30 AM  CHIEF COMPLAINT: Dysuria, penile discharge  HPI: Pt is a 20 y.o. male with history of Trichomonas who presents to the emergency department with dysuria that started this morning when he woke up to use the bathroom and penile discharge. States he is sexually active with one male partner. This is relatively new sexual partner for him. He is only been with her for the past 2 months. States they do not use condoms. He has had a history of Trichomonas in the past which was treated. Denies any testicular pain or swelling. Pain is only with urination. No fevers, chills, nausea, vomiting. Did have one episode of diarrhea this morning.  ROS: See HPI Constitutional: no fever  Eyes: no drainage  ENT: no runny nose   Cardiovascular:  no chest pain  Resp: no SOB  GI: no vomiting GU:  dysuria Integumentary: no rash  Allergy: no hives  Musculoskeletal: no leg swelling  Neurological: no slurred speech ROS otherwise negative  PAST MEDICAL HISTORY/PAST SURGICAL HISTORY:  Past Medical History:  Diagnosis Date  . Medical history non-contributory     MEDICATIONS:  Prior to Admission medications   Medication Sig Start Date End Date Taking? Authorizing Provider  amoxicillin (AMOXIL) 500 MG capsule Take 1 capsule (500 mg total) by mouth 3 (three) times daily. Patient not taking: Reported on 09/18/2014 08/08/14   Donnetta HutchingBrian Cook, MD  ibuprofen (ADVIL,MOTRIN) 800 MG tablet Take 1 tablet (800 mg total) by mouth 3 (three) times daily. 09/18/14   Elson AreasLeslie K Sofia, PA-C  oxyCODONE-acetaminophen (PERCOCET) 5-325 MG per tablet Take 1 tablet by mouth every 4 (four) hours as needed for moderate pain. Patient not taking: Reported on 08/06/2014 05/04/14   Dione Boozeavid Glick, MD  predniSONE (DELTASONE) 50 MG tablet 1 tablet for 7 days Patient not taking: Reported on 09/18/2014 08/08/14   Donnetta HutchingBrian Cook, MD  promethazine (PHENERGAN) 25 MG tablet Take 1 tablet (25 mg total) by mouth every 6 (six) hours as needed. Patient  not taking: Reported on 09/18/2014 08/08/14   Donnetta HutchingBrian Cook, MD  traMADol (ULTRAM) 50 MG tablet Take 1 tablet (50 mg total) by mouth every 6 (six) hours as needed. Patient not taking: Reported on 08/06/2014 05/04/14   Dione Boozeavid Glick, MD    ALLERGIES:  No Known Allergies  SOCIAL HISTORY:  Social History  Substance Use Topics  . Smoking status: Current Some Day Smoker    Packs/day: 0.50    Types: Cigarettes  . Smokeless tobacco: Not on file  . Alcohol use No    FAMILY HISTORY: Family History  Problem Relation Age of Onset  . Colon cancer Neg Hx   . Colon polyps Neg Hx     EXAM: BP (!) 145/103   Pulse (!) 56   Temp 97.7 F (36.5 C)   Resp 18   Ht 6\' 3"  (1.905 m)   Wt 160 lb (72.6 kg)   SpO2 99%   BMI 20.00 kg/m  CONSTITUTIONAL: Alert and oriented and responds appropriately to questions. Well-appearing; well-nourished HEAD: Normocephalic EYES: Conjunctivae clear, PERRL ENT: normal nose; no rhinorrhea; moist mucous membranes NECK: Supple, no meningismus, no LAD  CARD: RRR; S1 and S2 appreciated; no murmurs, no clicks, no rubs, no gallops RESP: Normal chest excursion without splinting or tachypnea; breath sounds clear and equal bilaterally; no wheezes, no rhonchi, no rales, no hypoxia or respiratory distress, speaking full sentences ABD/GI: Normal bowel sounds; non-distended; soft, non-tender, no rebound, no guarding, no peritoneal signs GU:  Normal external genitalia,  circumcised male, normal penile shaft, no blood at the urethral meatus but patient does have white, yellow appearing discharge at the urethral meatus, no testicular masses or tenderness on exam, no scrotal masses or swelling, no hernias appreciated, 2+ femoral pulses bilaterally; no perineal erythema, warmth, subcutaneous air or crepitus; no high riding testicle, normal bilateral cremasteric reflex.  Chaperone present for exam. BACK:  The back appears normal and is non-tender to palpation, there is no CVA tenderness EXT:  Normal ROM in all joints; non-tender to palpation; no edema; normal capillary refill; no cyanosis, no calf tenderness or swelling    SKIN: Normal color for age and race; warm; no rash NEURO: Moves all extremities equally, sensation to light touch intact diffusely, cranial nerves II through XII intact PSYCH: The patient's mood and manner are appropriate. Grooming and personal hygiene are appropriate.  MEDICAL DECISION MAKING: Patient here with penile discharge, dysuria. Will treat empirically for gonorrhea and chlamydia. Urethral swab has been sent. Patient agrees to HIV and syphilis testing as well. No other lesions noted on exam. No testicular pain or tenderness. No hernia warmth, erythema, subcutaneous emphysema. Normal penile shaft. Normal glans.  Urinalysis also pending.  ED PROGRESS: Urine shows white blood cells, red blood cells, many bacteria. Suspect from STD but will also cover him for a UTI. Will discharge home on Keflex. Discussed return precautions. Have advised him to avoid any intercourse for at least 1 week after treatment and have recommended that his partner be tested as well for STDs to make sure she is negative before they resume intercourse. Discussed return precautions. He verbalizes understanding and is comfortable with this plan.      At this time, I do not feel there is any life-threatening condition present. I have reviewed and discussed all results (EKG, imaging, lab, urine as appropriate), exam findings with patient/family. I have reviewed nursing notes and appropriate previous records.  I feel the patient is safe to be discharged home without further emergent workup and can continue workup as an outpatient as needed. Discussed usual and customary return precautions. Patient/family verbalize understanding and are comfortable with this plan.  Outpatient follow-up has been provided. All questions have been answered.    Layla Maw Pennelope Basque, DO 10/09/15 [phone]

## 2015-10-10 LAB — URINE CULTURE

## 2015-10-10 LAB — GC/CHLAMYDIA PROBE AMP (~~LOC~~) NOT AT ARMC
Chlamydia: POSITIVE — AB
NEISSERIA GONORRHEA: POSITIVE — AB

## 2015-10-10 LAB — RPR: RPR Ser Ql: NONREACTIVE

## 2015-10-10 LAB — HIV ANTIBODY (ROUTINE TESTING W REFLEX): HIV Screen 4th Generation wRfx: NONREACTIVE

## 2015-10-14 ENCOUNTER — Telehealth (HOSPITAL_BASED_OUTPATIENT_CLINIC_OR_DEPARTMENT_OTHER): Payer: Self-pay | Admitting: Emergency Medicine

## 2015-11-19 ENCOUNTER — Telehealth (HOSPITAL_BASED_OUTPATIENT_CLINIC_OR_DEPARTMENT_OTHER): Payer: Self-pay | Admitting: Emergency Medicine

## 2015-11-19 NOTE — Telephone Encounter (Signed)
Lost to followup 

## 2016-01-14 ENCOUNTER — Encounter (HOSPITAL_COMMUNITY): Payer: Self-pay

## 2016-01-14 ENCOUNTER — Emergency Department (HOSPITAL_COMMUNITY)
Admission: EM | Admit: 2016-01-14 | Discharge: 2016-01-14 | Disposition: A | Discharge status: Home / Self Care | Payer: BLUE CROSS/BLUE SHIELD | Attending: Emergency Medicine | Admitting: Emergency Medicine

## 2016-01-14 DIAGNOSIS — F17210 Nicotine dependence, cigarettes, uncomplicated: Secondary | ICD-10-CM | POA: Insufficient documentation

## 2016-01-14 DIAGNOSIS — K529 Noninfective gastroenteritis and colitis, unspecified: Secondary | ICD-10-CM | POA: Insufficient documentation

## 2016-01-14 DIAGNOSIS — R1084 Generalized abdominal pain: Secondary | ICD-10-CM | POA: Diagnosis present

## 2016-01-14 LAB — CBC WITH DIFFERENTIAL/PLATELET
BASOS ABS: 0 10*3/uL (ref 0.0–0.1)
Basophils Relative: 0 %
EOS ABS: 0 10*3/uL (ref 0.0–0.7)
EOS PCT: 0 %
HCT: 46.4 % (ref 39.0–52.0)
Hemoglobin: 15.7 g/dL (ref 13.0–17.0)
Lymphocytes Relative: 8 %
Lymphs Abs: 0.9 10*3/uL (ref 0.7–4.0)
MCH: 30.5 pg (ref 26.0–34.0)
MCHC: 33.8 g/dL (ref 30.0–36.0)
MCV: 90.3 fL (ref 78.0–100.0)
MONO ABS: 0.6 10*3/uL (ref 0.1–1.0)
Monocytes Relative: 6 %
Neutro Abs: 9.7 10*3/uL — ABNORMAL HIGH (ref 1.7–7.7)
Neutrophils Relative %: 86 %
PLATELETS: 201 10*3/uL (ref 150–400)
RBC: 5.14 MIL/uL (ref 4.22–5.81)
RDW: 13.4 % (ref 11.5–15.5)
WBC: 11.3 10*3/uL — AB (ref 4.0–10.5)

## 2016-01-14 LAB — COMPREHENSIVE METABOLIC PANEL
ALT: 28 U/L (ref 17–63)
AST: 36 U/L (ref 15–41)
Albumin: 4.7 g/dL (ref 3.5–5.0)
Alkaline Phosphatase: 65 U/L (ref 38–126)
Anion gap: 8 (ref 5–15)
BUN: 17 mg/dL (ref 6–20)
CHLORIDE: 102 mmol/L (ref 101–111)
CO2: 27 mmol/L (ref 22–32)
CREATININE: 1.15 mg/dL (ref 0.61–1.24)
Calcium: 9.6 mg/dL (ref 8.9–10.3)
GFR calc Af Amer: >60 mL/min (ref >60)
GFR calc non Af Amer: >60 mL/min (ref >60)
Glucose, Bld: 96 mg/dL (ref 65–99)
POTASSIUM: 3.9 mmol/L (ref 3.5–5.1)
SODIUM: 137 mmol/L (ref 135–145)
Total Bilirubin: 0.8 mg/dL (ref 0.3–1.2)
Total Protein: 7.9 g/dL (ref 6.5–8.1)

## 2016-01-14 LAB — URINALYSIS, ROUTINE W REFLEX MICROSCOPIC
Bacteria, UA: NONE SEEN
Bilirubin Urine: NEGATIVE
Glucose, UA: NEGATIVE mg/dL
Ketones, ur: 80 mg/dL — AB
Leukocytes, UA: NEGATIVE
NITRITE: NEGATIVE
PROTEIN: 100 mg/dL — AB
SPECIFIC GRAVITY, URINE: 1.027 (ref 1.005–1.030)
pH: 6 (ref 5.0–8.0)

## 2016-01-14 MED ORDER — IBUPROFEN 600 MG PO TABS: 600.0000 mg | ORAL_TABLET | Freq: Four times a day (QID) | ORAL | 0 refills | Status: DC

## 2016-01-14 MED ORDER — PROMETHAZINE HCL 12.5 MG PO TABS
25.0000 mg | ORAL_TABLET | Freq: Once | ORAL | Status: AC
  Administered 2016-01-14: 25 mg via ORAL
  Filled 2016-01-14: qty 2

## 2016-01-14 MED ORDER — ONDANSETRON HCL 4 MG PO TABS: 4.0000 mg | ORAL_TABLET | Freq: Four times a day (QID) | ORAL | 0 refills | Status: DC

## 2016-01-14 MED ORDER — IBUPROFEN 800 MG PO TABS
800.0000 mg | ORAL_TABLET | Freq: Once | ORAL | Status: AC
  Administered 2016-01-14: 800 mg via ORAL
  Filled 2016-01-14: qty 1

## 2016-01-14 NOTE — Discharge Instructions (Signed)
Your examination suggest a viral illness with nausea and vomiting. Please increase water, juices, Gatorade, popsicles, Kool-Aid, etc. Please wash hands frequently. Use Zofran every 6 hours for nausea/vomiting. Use ibuprofen with breakfast, lunch, dinner, and at bedtime for the next 2 days, then every 6 hours as needed. Please see your primary physician or return to the emergency department if not improving.

## 2016-01-14 NOTE — ED Triage Notes (Signed)
Pt reports n/v and abd pain since last night.  No diarrhea.  LBM was yesterday.

## 2016-01-14 NOTE — ED Provider Notes (Signed)
[facility] Provider Note   CSN: [id number] Arrival date & time: 01/14/16  1134     History   Chief Complaint Chief Complaint  Patient presents with  . Abdominal Pain  . Emesis    HPI Nathaniel Sparks is a 20 y.o. male.  Pt has had URI symptoms for a few days. Had generalized abd pain, N/V starting at midnight last night.   The history is provided by the patient.  Abdominal Pain   This is a new problem. The current episode started 12 to 24 hours ago. The problem occurs hourly. The problem has been gradually worsening. The pain is associated with an unknown factor. The pain is located in the generalized abdominal region. The pain is mild. Associated symptoms include nausea, vomiting and myalgias. Pertinent negatives include fever, diarrhea and constipation. Nothing aggravates the symptoms. Nothing relieves the symptoms. His past medical history does not include PUD, gallstones or irritable bowel syndrome.  Emesis   Associated symptoms include abdominal pain and myalgias. Pertinent negatives include no diarrhea and no fever.    Past Medical History:  Diagnosis Date  . Medical history non-contributory     Patient Active Problem List   Diagnosis Date Noted  . Sepsis (HCC) 08/07/2014  . Sepsis due to Streptococcus, group A (HCC) 08/07/2014  . Dehydration 08/06/2014  . Streptococcal pharyngitis 08/06/2014  . Rectal bleeding 12/01/2012  . FINGER SPRAIN 10/11/2008    Past Surgical History:  Procedure Laterality Date  . COLONOSCOPY N/A 02/20/2013   WUJ:WJXBJYSLF:Normal mucosa in the terminal ileum/Normal colon/RECTAL BLEEDING DUE TO Moderate sized internal hemorrhoids  . HEMORRHOID BANDING N/A 02/20/2013   Procedure: HEMORRHOID BANDING;  Surgeon: West BaliSandi L Fields, MD;  Location: [facility]O SUITE;  Service: Endoscopy;  Laterality: N/A;  . NO PAST SURGERIES         Home Medications    Prior to Admission medications   Medication Sig Start Date End Date Taking? Authorizing  Provider  cephALEXin (KEFLEX) 500 MG capsule Take 1 capsule (500 mg total) by mouth 2 (two) times daily. 10/09/15   Kristen N Ward, DO  ibuprofen (ADVIL,MOTRIN) 600 MG tablet Take 1 tablet (600 mg total) by mouth 4 (four) times daily. 01/14/16   Ivery QualeHobson Lamyah Creed, PA-C  ondansetron (ZOFRAN) 4 MG tablet Take 1 tablet (4 mg total) by mouth every 6 (six) hours. 01/14/16   Ivery QualeHobson Tyrah Broers, PA-C    Family History Family History  Problem Relation Age of Onset  . Colon cancer Neg Hx   . Colon polyps Neg Hx     Social History Social History  Substance Use Topics  . Smoking status: Current Some Day Smoker    Packs/day: 0.50    Types: Cigarettes  . Smokeless tobacco: Never Used  . Alcohol use Yes     Comment: occ     Allergies   Patient has no known allergies.   Review of Systems Review of Systems  Constitutional: Negative for fever.  Gastrointestinal: Positive for abdominal pain, nausea and vomiting. Negative for constipation and diarrhea.  Musculoskeletal: Positive for myalgias.  All other systems reviewed and are negative.    Physical Exam Updated Vital Signs BP 154/81 (BP Location: Left Arm)   Pulse 98   Temp 99.6 F (37.6 C) (Temporal)   Resp 20   Ht 6\' 3"  (1.905 m)   Wt 70.3 kg   SpO2 100%   BMI 19.37 kg/m   Physical Exam  Constitutional: He is oriented to person, place, and time. He  appears well-developed and well-nourished.  Non-toxic appearance.  HENT:  Head: Normocephalic.  Right Ear: Tympanic membrane and external ear normal.  Left Ear: Tympanic membrane and external ear normal.  Nasal congestion.  Eyes: EOM and lids are normal. Pupils are equal, round, and reactive to light.  Neck: Normal range of motion. Neck supple. Carotid bruit is not present.  Cardiovascular: Normal rate, regular rhythm, normal heart sounds, intact distal pulses and normal pulses.   Pulmonary/Chest: Breath sounds normal. No respiratory distress.  Abdominal: Soft. Bowel sounds are normal.  There is no tenderness. There is no guarding.  Musculoskeletal: Normal range of motion.  Lymphadenopathy:       Head (right side): No submandibular adenopathy present.       Head (left side): No submandibular adenopathy present.    He has no cervical adenopathy.  Neurological: He is alert and oriented to person, place, and time. He has normal strength. No cranial nerve deficit or sensory deficit.  Skin: Skin is warm and dry.  Psychiatric: He has a normal mood and affect. His speech is normal.  Nursing note and vitals reviewed.    ED Treatments / Results  Labs (all labs ordered are listed, but only abnormal results are displayed) Labs Reviewed  CBC WITH DIFFERENTIAL/PLATELET - Abnormal; Notable for the following:       Result Value   WBC 11.3 (*)    Neutro Abs 9.7 (*)    All other components within normal limits  URINALYSIS, ROUTINE W REFLEX MICROSCOPIC - Abnormal; Notable for the following:    Hgb urine dipstick SMALL (*)    Ketones, ur 80 (*)    Protein, ur 100 (*)    All other components within normal limits  COMPREHENSIVE METABOLIC PANEL    EKG  EKG Interpretation None       Radiology No results found.  Procedures Procedures (including critical care time)  Medications Ordered in ED Medications  promethazine (PHENERGAN) tablet 25 mg (25 mg Oral Given 01/14/16 1710)  ibuprofen (ADVIL,MOTRIN) tablet 800 mg (800 mg Oral Given 01/14/16 1710)     Initial Impression / Assessment and Plan / ED Course  I have reviewed the triage vital signs and the nursing notes.  Pertinent labs & imaging results that were available during my care of the patient were reviewed by me and considered in my medical decision making (see chart for details).  Clinical Course     **I have reviewed nursing notes, vital signs, and all appropriate lab and imaging results for this patient.*  Final Clinical Impressions(s) / ED Diagnoses  The complete blood count shows the platelets cells to  be elevated at 11.3, otherwise the complete blood count is within normal limits. There is no shift to the left. The competence of metabolic panel is well within normal limits. The urine analysis shows some ketonuria, but no urinary tract infection, no evidence of kidney stone.  I suspect the patient has a viral gastroenteritis. The patient will be treated with Zofran, increasing fluids, and on return to the emergency department if any changes or problems.    Final diagnoses:  Gastroenteritis    New Prescriptions New Prescriptions   IBUPROFEN (ADVIL,MOTRIN) 600 MG TABLET    Take 1 tablet (600 mg total) by mouth 4 (four) times daily.   ONDANSETRON (ZOFRAN) 4 MG TABLET    Take 1 tablet (4 mg total) by mouth every 6 (six) hours.     Ivery Quale, PA-C 01/14/16 1722    Jacalyn Lefevre,  MD 01/14/16 1739

## 2016-11-10 IMAGING — CT CT NECK W/ CM
3 of 4 series · 13 of 33 positions shown, 16 images · IV contrast (Omnipaque 300)
Comparison: None.

CLINICAL DATA: Sore throat for 4 days. The patient is unable to
swallow.

EXAM:
CT NECK WITH CONTRAST
TECHNIQUE: Multidetector CT imaging of the neck was performed using the
standard protocol following the bolus administration of intravenous
contrast.
CONTRAST:  75mL OMNIPAQUE IOHEXOL 300 MG/ML  SOLN

[Series 2: soft tissue neck 2.0 b31s · axial · 0.41mm/px · z∈[+72,+248]mm · 5 of 132 slices shown, 7 images]
[im 22/132  soft-tissue]
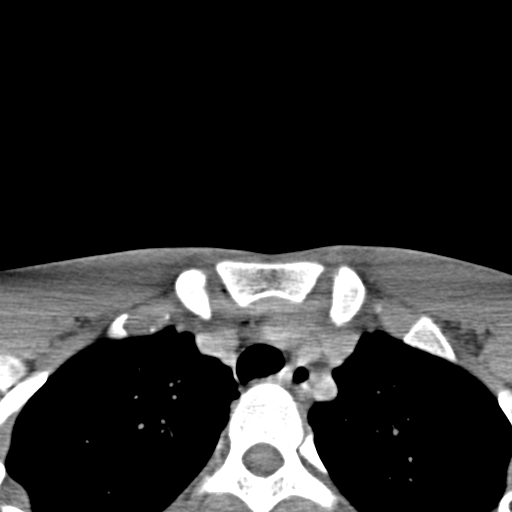
[im 22/132  bone]
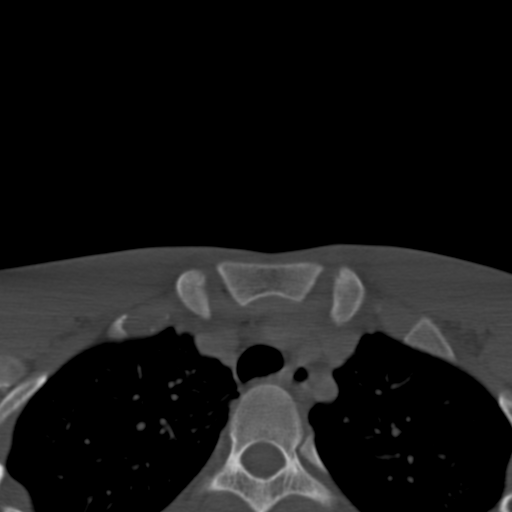
[im 44/132  bone]
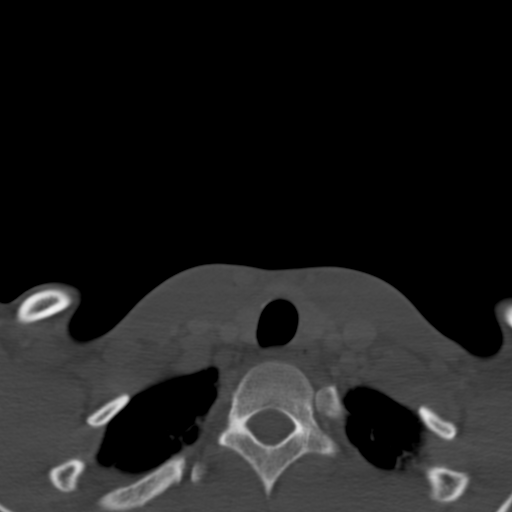
[im 66/132  bone]
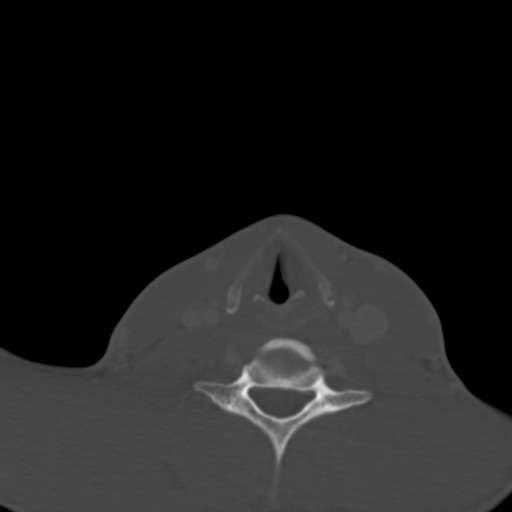
[im 88/132  bone]
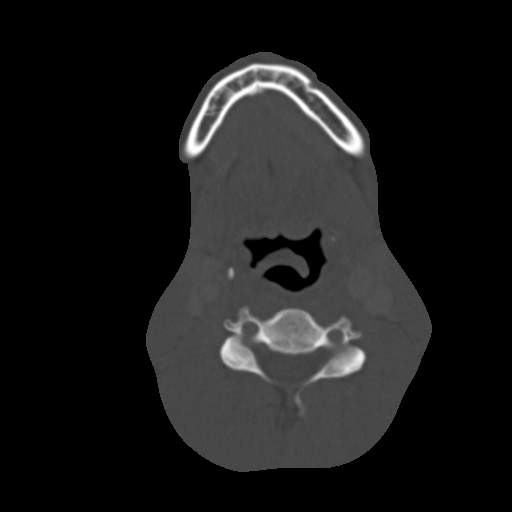
[im 110/132  soft-tissue]
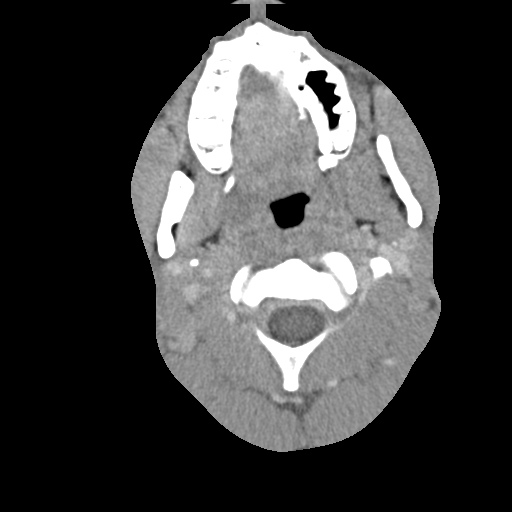
[im 110/132  bone]
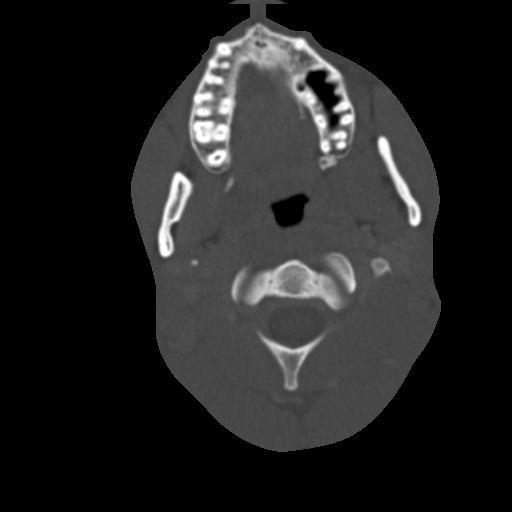

[Series 4: neck 2.0 soft tissue sag · sagittal · 0.42mm/px · 5 of 77 slices shown, 6 images]
[im 26/77  bone]
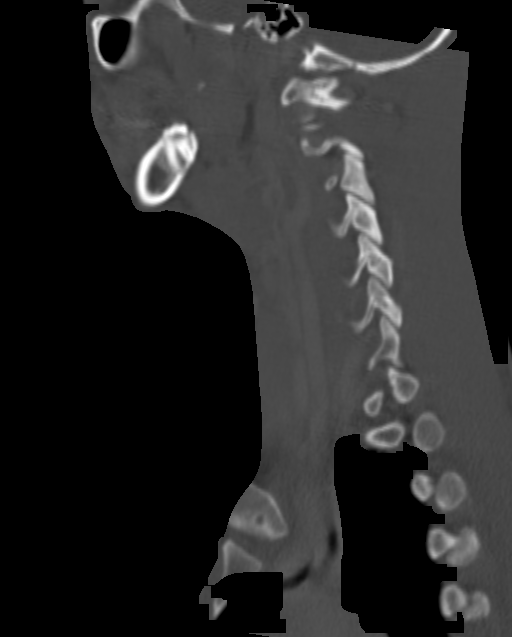
[im 32/77  bone]
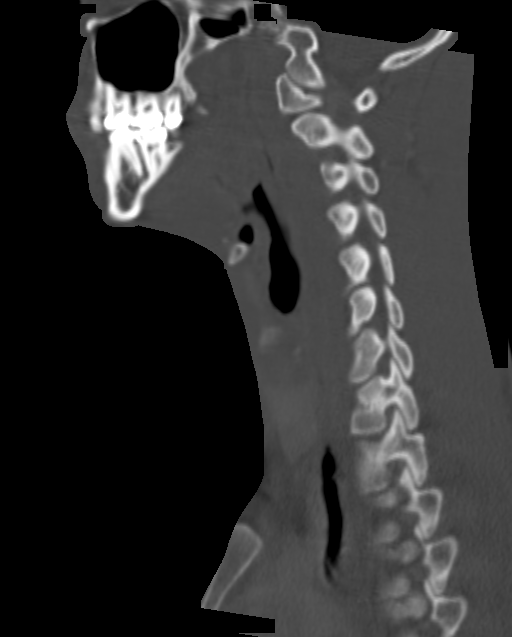
[im 39/77  soft-tissue]
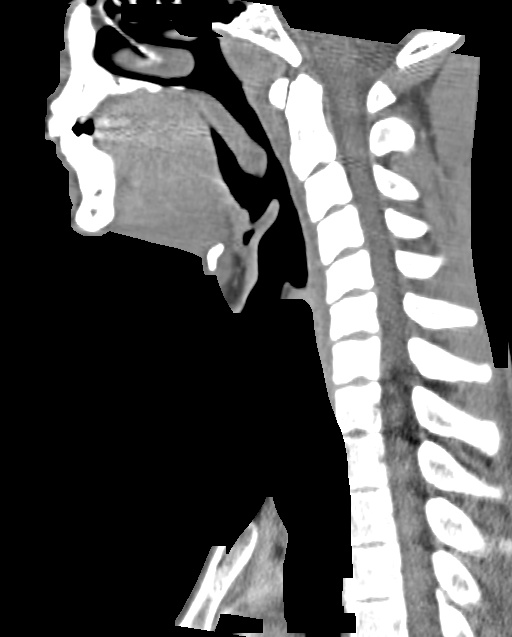
[im 39/77  bone]
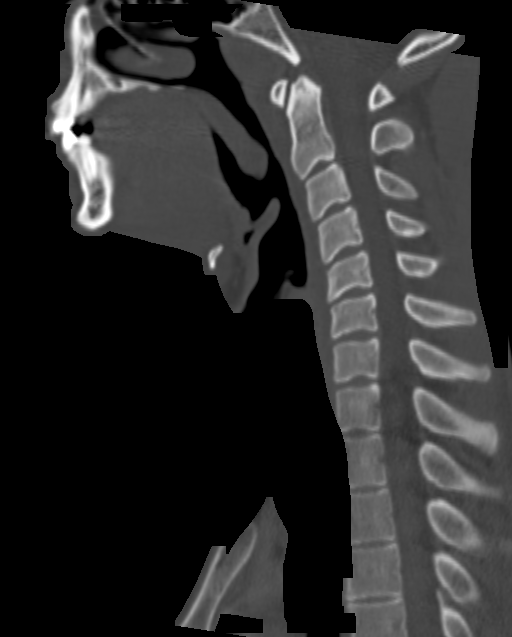
[im 45/77  bone]
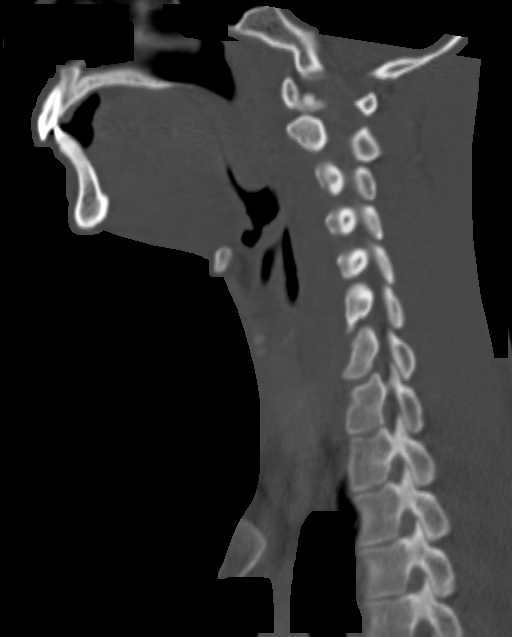
[im 51/77  bone]
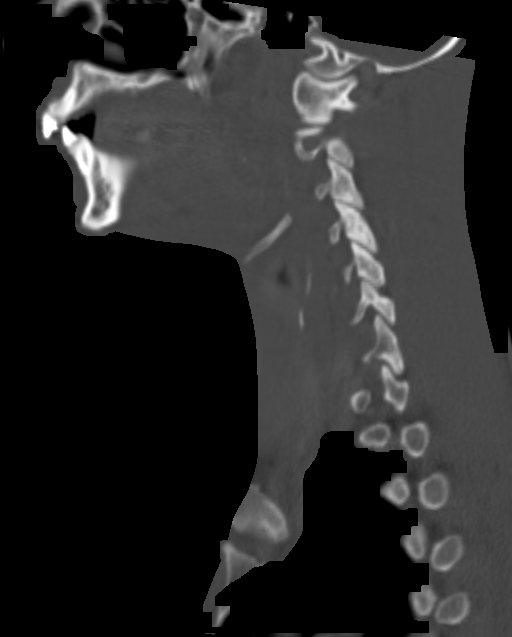

[Series 5: neck 2.0 soft tissue coro · coronal · 0.30mm/px · 3 of 87 slices shown]
[im 18/87  bone]
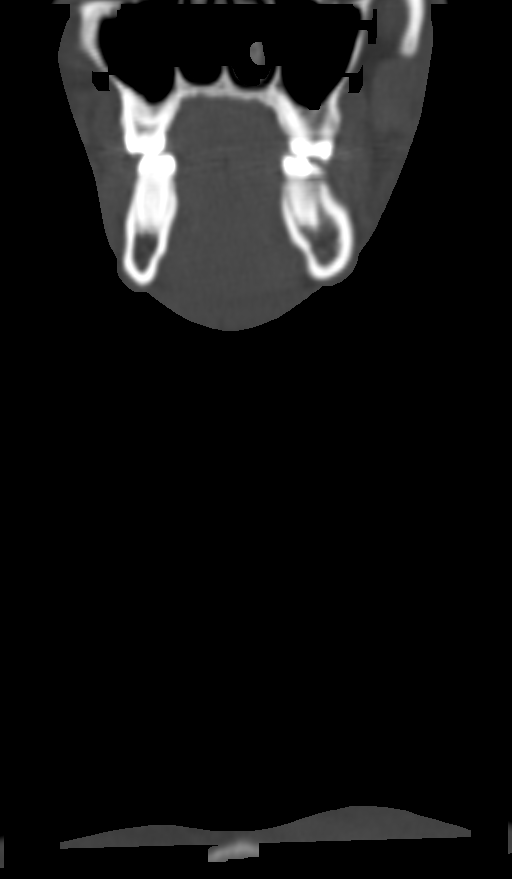
[im 35/87  bone]
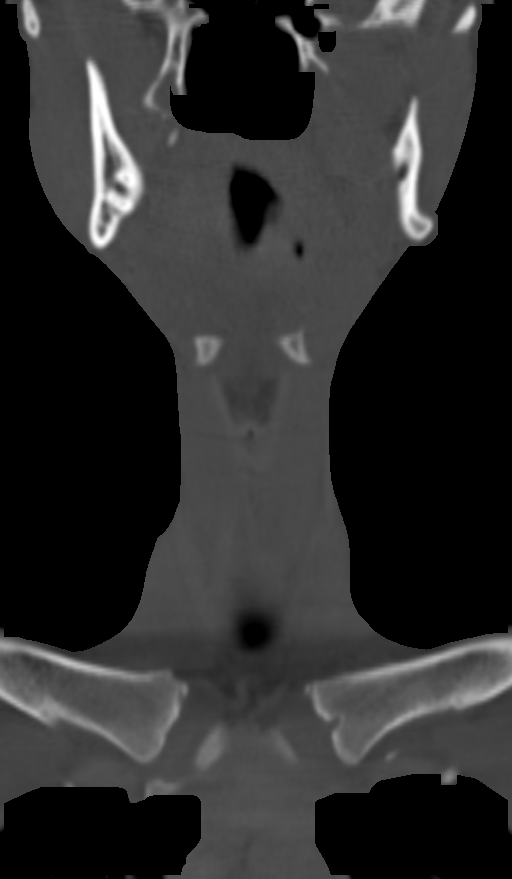
[im 52/87  bone]
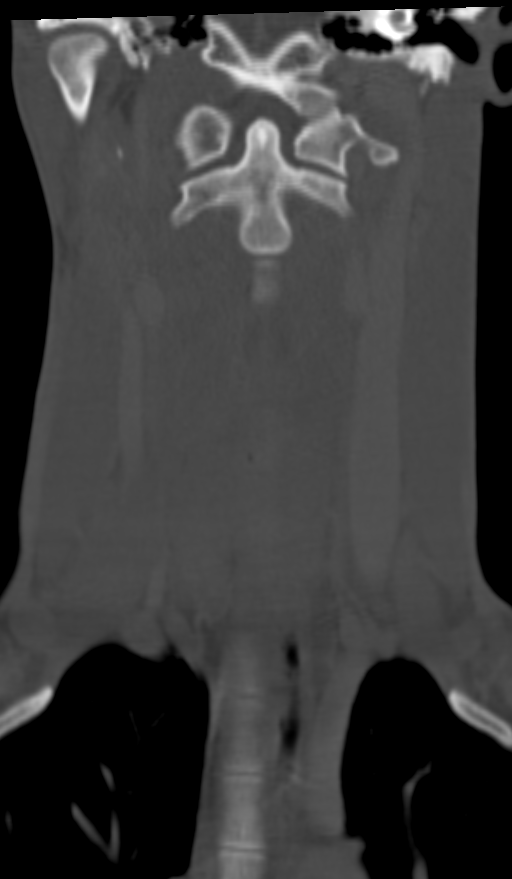

[13 of 33 positions shown; findings below may reference images not displayed]

FINDINGS: Pharynx and larynx: There is edema of the adenoids and in the
prevertebral soft tissues extending from C2 to C4-5. The soft palate
is prominent. Epiglottis is normal. Lingual tonsils are slightly
prominent.

Salivary glands: Normal.

Thyroid: Normal.

Lymph nodes: Small reactive lymph nodes in both sides of the neck.

Vascular: Normal.

Limited intracranial: Normal.

Visualized orbits: Normal.

Mastoids and visualized paranasal sinuses: Normal.

Skeleton: Normal.

Upper chest: Normal.
IMPRESSION: Prominent pharyngitis with enlarged lymph nodes and prevertebral
edema without a definable abscess.

## 2016-11-16 DIAGNOSIS — Z202 Contact with and (suspected) exposure to infections with a predominantly sexual mode of transmission: Secondary | ICD-10-CM | POA: Diagnosis not present

## 2016-11-16 DIAGNOSIS — F17210 Nicotine dependence, cigarettes, uncomplicated: Secondary | ICD-10-CM | POA: Insufficient documentation

## 2016-11-16 DIAGNOSIS — R300 Dysuria: Secondary | ICD-10-CM | POA: Insufficient documentation

## 2016-11-17 ENCOUNTER — Encounter (HOSPITAL_COMMUNITY): Payer: Self-pay | Admitting: *Deleted

## 2016-11-17 ENCOUNTER — Emergency Department (HOSPITAL_COMMUNITY)
Admission: EM | Admit: 2016-11-17 | Discharge: 2016-11-17 | Disposition: A | Discharge status: Home / Self Care | Payer: BLUE CROSS/BLUE SHIELD | Attending: Emergency Medicine | Admitting: Emergency Medicine

## 2016-11-17 DIAGNOSIS — Z202 Contact with and (suspected) exposure to infections with a predominantly sexual mode of transmission: Secondary | ICD-10-CM

## 2016-11-17 LAB — URINALYSIS, ROUTINE W REFLEX MICROSCOPIC
Bilirubin Urine: NEGATIVE
GLUCOSE, UA: NEGATIVE mg/dL
HGB URINE DIPSTICK: NEGATIVE
KETONES UR: NEGATIVE mg/dL
LEUKOCYTES UA: NEGATIVE
Nitrite: NEGATIVE
PH: 6 (ref 5.0–8.0)
PROTEIN: NEGATIVE mg/dL
Specific Gravity, Urine: 1.026 (ref 1.005–1.030)

## 2016-11-17 MED ORDER — STERILE WATER FOR INJECTION IJ SOLN
INTRAMUSCULAR | Status: AC
  Filled 2016-11-17: qty 10

## 2016-11-17 MED ORDER — CEFTRIAXONE SODIUM 250 MG IJ SOLR
250.0000 mg | Freq: Once | INTRAMUSCULAR | Status: AC
  Administered 2016-11-17: 250 mg via INTRAMUSCULAR
  Filled 2016-11-17: qty 250

## 2016-11-17 MED ORDER — AZITHROMYCIN 250 MG PO TABS
1000.0000 mg | ORAL_TABLET | Freq: Once | ORAL | Status: AC
  Administered 2016-11-17: 1000 mg via ORAL
  Filled 2016-11-17: qty 4

## 2016-11-17 NOTE — ED Provider Notes (Signed)
[facility] EMERGENCY DEPARTMENT Provider Note   CSN: [id number] Arrival date & time: 11/16/16  2344     History   Chief Complaint Chief Complaint  Patient presents with  . Dysuria    HPI Nathaniel Sparks is a 21 y.o. male.  Patient states his girlfriend with whom he has unprotected sex recently tested positive for gonorrhea.  He denies any symptoms other than intermittent burning with urination over the past several weeks.  Denies any penile discharge or bleeding.  Denies any hematuria.  No fever, vomiting, abdominal pain or back pain.  No testicular pain.   The history is provided by the patient.  Dysuria   Pertinent negatives include no nausea, no vomiting, no hematuria, no urgency and no flank pain.    Past Medical History:  Diagnosis Date  . Medical history non-contributory     Patient Active Problem List   Diagnosis Date Noted  . Sepsis (HCC) 08/07/2014  . Sepsis due to Streptococcus, group A (HCC) 08/07/2014  . Dehydration 08/06/2014  . Streptococcal pharyngitis 08/06/2014  . Rectal bleeding 12/01/2012  . FINGER SPRAIN 10/11/2008    Past Surgical History:  Procedure Laterality Date  . COLONOSCOPY N/A 02/20/2013   NWG:NFAOZH mucosa in the terminal ileum/Normal colon/RECTAL BLEEDING DUE TO Moderate sized internal hemorrhoids  . HEMORRHOID BANDING N/A 02/20/2013   Procedure: HEMORRHOID BANDING;  Surgeon: West Bali, MD;  Location: [facility]O SUITE;  Service: Endoscopy;  Laterality: N/A;  . NO PAST SURGERIES         Home Medications    Prior to Admission medications   Not on File    Family History Family History  Problem Relation Age of Onset  . Colon cancer Neg Hx   . Colon polyps Neg Hx     Social History Social History  Substance Use Topics  . Smoking status: Current Some Day Smoker    Packs/day: 0.50    Types: Cigarettes  . Smokeless tobacco: Never Used  . Alcohol use Yes     Comment: occ     Allergies   Patient has no known  allergies.   Review of Systems Review of Systems  Constitutional: Negative for activity change, appetite change and fever.  HENT: Negative for congestion and rhinorrhea.   Respiratory: Negative for cough, chest tightness and shortness of breath.   Cardiovascular: Negative for chest pain.  Gastrointestinal: Negative for abdominal pain, nausea and vomiting.  Genitourinary: Positive for dysuria. Negative for discharge, flank pain, hematuria, testicular pain and urgency.  Musculoskeletal: Negative for back pain and neck stiffness.  Skin: Negative for rash.  Neurological: Negative for dizziness, weakness and light-headedness.   all other systems are negative except as noted in the HPI and PMH.     Physical Exam Updated Vital Signs BP (!) 142/94 (BP Location: Left Arm)   Pulse 74   Temp 98 F (36.7 C) (Oral)   Resp 18   Ht 6\' 3"  (1.905 m)   Wt 72.1 kg (159 lb)   SpO2 99%   BMI 19.87 kg/m   Physical Exam  Constitutional: He is oriented to person, place, and time. He appears well-developed and well-nourished. No distress.  HENT:  Head: Normocephalic and atraumatic.  Mouth/Throat: Oropharynx is clear and moist. No oropharyngeal exudate.  Eyes: Pupils are equal, round, and reactive to light. Conjunctivae and EOM are normal.  Neck: Normal range of motion. Neck supple.  No meningismus.  Cardiovascular: Normal rate, regular rhythm, normal heart sounds and intact distal  pulses.   No murmur heard. Pulmonary/Chest: Effort normal and breath sounds normal. No respiratory distress.  Abdominal: Soft. There is no tenderness. There is no rebound and no guarding.  Genitourinary:  Genitourinary Comments: Has no rashes, testicles nontender, no discharge  Musculoskeletal: Normal range of motion. He exhibits no edema or tenderness.  Neurological: He is alert and oriented to person, place, and time. No cranial nerve deficit. He exhibits normal muscle tone. Coordination normal.   5/5 strength  throughout. CN 2-12 intact.Equal grip strength.   Skin: Skin is warm.  Psychiatric: He has a normal mood and affect. His behavior is normal.  Nursing note and vitals reviewed.    ED Treatments / Results  Labs (all labs ordered are listed, but only abnormal results are displayed) Labs Reviewed  URINALYSIS, ROUTINE W REFLEX MICROSCOPIC  GC/CHLAMYDIA PROBE AMP ([place]) NOT AT [facility]    EKG  EKG Interpretation None       Radiology No results found.  Procedures Procedures (including critical care time)  Medications Ordered in ED Medications  cefTRIAXone (ROCEPHIN) injection 250 mg (not administered)  azithromycin (ZITHROMAX) tablet 1,000 mg (not administered)     Initial Impression / Assessment and Plan / ED Course  I have reviewed the triage vital signs and the nursing notes.  Pertinent labs & imaging results that were available during my care of the patient were reviewed by me and considered in my medical decision making (see chart for details).     Patient with exposure to gonorrhea.  No Symptoms at this time.  Well-appearing.  Patient treated with IM Rocephin and p.o. azithromycin for gonorrhea and chlamydia.  Urinalysis is negative.  Follow-up with PCP at health department.  Safe sex practices discussed.  Return precautions discussed  Final Clinical Impressions(s) / ED Diagnoses   Final diagnoses:  STD exposure    New Prescriptions New Prescriptions   No medications on file     Glynn Octaveancour, Macaila Tahir, MD 11/17/16 [phone]

## 2016-11-17 NOTE — Discharge Instructions (Signed)
You were treated for gonorrhea and chlamydia.  Practice safe sex practices with condoms.  Follow-up with your primary care physician or the health department.  Return to the ED if you develop new or worsening symptoms.

## 2016-11-17 NOTE — ED Triage Notes (Signed)
Pt c/o burning with urination for the last 2-3 weeks; pt states his girlfriend went to her OBGYN x 2-3 days ago and was diagnosed with [place]rea

## 2017-07-31 ENCOUNTER — Emergency Department (HOSPITAL_COMMUNITY)
Admission: EM | Admit: 2017-07-31 | Discharge: 2017-07-31 | Disposition: A | Discharge status: Home / Self Care | Payer: BLUE CROSS/BLUE SHIELD | Attending: Emergency Medicine | Admitting: Emergency Medicine

## 2017-07-31 ENCOUNTER — Encounter (HOSPITAL_COMMUNITY): Payer: Self-pay | Admitting: Emergency Medicine

## 2017-07-31 ENCOUNTER — Other Ambulatory Visit: Payer: Self-pay

## 2017-07-31 ENCOUNTER — Emergency Department (HOSPITAL_COMMUNITY): Payer: BLUE CROSS/BLUE SHIELD

## 2017-07-31 DIAGNOSIS — S0990XA Unspecified injury of head, initial encounter: Secondary | ICD-10-CM | POA: Diagnosis not present

## 2017-07-31 DIAGNOSIS — S0285XA Fracture of orbit, unspecified, initial encounter for closed fracture: Secondary | ICD-10-CM

## 2017-07-31 DIAGNOSIS — S0281XA Fracture of other specified skull and facial bones, right side, initial encounter for closed fracture: Secondary | ICD-10-CM | POA: Insufficient documentation

## 2017-07-31 DIAGNOSIS — Y929 Unspecified place or not applicable: Secondary | ICD-10-CM | POA: Diagnosis not present

## 2017-07-31 DIAGNOSIS — Y998 Other external cause status: Secondary | ICD-10-CM | POA: Insufficient documentation

## 2017-07-31 DIAGNOSIS — Y939 Activity, unspecified: Secondary | ICD-10-CM | POA: Insufficient documentation

## 2017-07-31 DIAGNOSIS — S0993XA Unspecified injury of face, initial encounter: Secondary | ICD-10-CM | POA: Diagnosis not present

## 2017-07-31 DIAGNOSIS — R4020 Unspecified coma: Secondary | ICD-10-CM | POA: Diagnosis not present

## 2017-07-31 DIAGNOSIS — F17210 Nicotine dependence, cigarettes, uncomplicated: Secondary | ICD-10-CM | POA: Diagnosis not present

## 2017-07-31 DIAGNOSIS — R630 Anorexia: Secondary | ICD-10-CM | POA: Diagnosis not present

## 2017-07-31 DIAGNOSIS — F0781 Postconcussional syndrome: Secondary | ICD-10-CM

## 2017-07-31 DIAGNOSIS — R1010 Upper abdominal pain, unspecified: Secondary | ICD-10-CM | POA: Diagnosis not present

## 2017-07-31 DIAGNOSIS — S0231XA Fracture of orbital floor, right side, initial encounter for closed fracture: Secondary | ICD-10-CM | POA: Diagnosis not present

## 2017-07-31 MED ORDER — HYDROCODONE-ACETAMINOPHEN 5-325 MG PO TABS: 1.0000 | ORAL_TABLET | Freq: Four times a day (QID) | ORAL | 0 refills | Status: AC | PRN

## 2017-07-31 MED ORDER — ONDANSETRON HCL 4 MG PO TABS: 4.0000 mg | ORAL_TABLET | Freq: Four times a day (QID) | ORAL | 0 refills | Status: AC

## 2017-07-31 MED ORDER — OXYCODONE-ACETAMINOPHEN 5-325 MG PO TABS
1.0000 | ORAL_TABLET | Freq: Once | ORAL | Status: AC
  Administered 2017-07-31: 1 via ORAL
  Filled 2017-07-31: qty 1
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
## 2017-12-29 DIAGNOSIS — F17200 Nicotine dependence, unspecified, uncomplicated: Secondary | ICD-10-CM | POA: Diagnosis not present

## 2017-12-29 DIAGNOSIS — R030 Elevated blood-pressure reading, without diagnosis of hypertension: Secondary | ICD-10-CM | POA: Diagnosis not present

## 2017-12-29 DIAGNOSIS — R7989 Other specified abnormal findings of blood chemistry: Secondary | ICD-10-CM | POA: Diagnosis not present

## 2017-12-29 DIAGNOSIS — Z0000 Encounter for general adult medical examination without abnormal findings: Secondary | ICD-10-CM | POA: Diagnosis not present

## 2017-12-29 DIAGNOSIS — Z0001 Encounter for general adult medical examination with abnormal findings: Secondary | ICD-10-CM | POA: Diagnosis not present

## 2017-12-29 DIAGNOSIS — Z23 Encounter for immunization: Secondary | ICD-10-CM | POA: Diagnosis not present

## 2018-01-04 DIAGNOSIS — R945 Abnormal results of liver function studies: Secondary | ICD-10-CM | POA: Diagnosis not present

## 2018-01-04 DIAGNOSIS — B182 Chronic viral hepatitis C: Secondary | ICD-10-CM | POA: Diagnosis not present

## 2018-01-04 DIAGNOSIS — Z0000 Encounter for general adult medical examination without abnormal findings: Secondary | ICD-10-CM | POA: Diagnosis not present

## 2018-01-10 ENCOUNTER — Encounter: Payer: Self-pay | Admitting: Gastroenterology

## 2018-03-30 ENCOUNTER — Telehealth: Payer: Self-pay | Admitting: Nurse Practitioner

## 2018-03-30 ENCOUNTER — Encounter: Payer: Self-pay | Admitting: Nurse Practitioner

## 2018-03-30 ENCOUNTER — Ambulatory Visit: Payer: BLUE CROSS/BLUE SHIELD | Admitting: Nurse Practitioner

## 2018-03-30 NOTE — Progress Notes (Deleted)
Primary Care Physician:  Patient, No Pcp Per Primary Gastroenterologist:  Dr.   Bonnetta Barry chief complaint on file.   HPI:   Nathaniel Sparks is a 23 y.o. male who presents on referral from primary care for hepatitis C.   Reviewed information provided with referral including ***.  The patient was remotely seen by our office 12/01/2012 for rectal bleeding.  Colonoscopy up-to-date 2015 which found moderate internal hemorrhoids status post banding.  Next colonoscopy due at age 52.  Today he states   Past Medical History:  Diagnosis Date  . Medical history non-contributory     Past Surgical History:  Procedure Laterality Date  . COLONOSCOPY N/A 02/20/2013   GYB:WLSLHT mucosa in the terminal ileum/Normal colon/RECTAL BLEEDING DUE TO Moderate sized internal hemorrhoids  . HEMORRHOID BANDING N/A 02/20/2013   Procedure: HEMORRHOID BANDING;  Surgeon: West Bali, MD;  Location: [facility];  Service: Endoscopy;  Laterality: N/A;  . NO PAST SURGERIES      Current Outpatient Medications  Medication Sig Dispense Refill  . HYDROcodone-acetaminophen (NORCO/VICODIN) 5-325 MG tablet Take 1-2 tablets by mouth every 6 (six) hours as needed. 10 tablet 0  . naproxen (NAPROSYN) 250 MG tablet Take 250 mg by mouth 2 (two) times daily with a meal.    . ondansetron (ZOFRAN) 4 MG tablet Take 1 tablet (4 mg total) by mouth every 6 (six) hours. 12 tablet 0   No current facility-administered medications for this visit.     Allergies as of 03/30/2018  . (No Known Allergies)    Family History  Problem Relation Age of Onset  . Colon cancer Neg Hx   . Colon polyps Neg Hx     Social History   Socioeconomic History  . Marital status: Single    Spouse name: Not on file  . Number of children: Not on file  . Years of education: Not on file  . Highest education level: Not on file  Occupational History  . Not on file  Social Needs  . Financial resource strain: Not on file  . Food insecurity:   Worry: Not on file    Inability: Not on file  . Transportation needs:    Medical: Not on file    Non-medical: Not on file  Tobacco Use  . Smoking status: Current Some Day Smoker    Packs/day: 0.50    Types: Cigarettes  . Smokeless tobacco: Never Used  Substance and Sexual Activity  . Alcohol use: Yes    Comment: q week  . Drug use: Yes    Types: Marijuana    Comment: q week  . Sexual activity: Not on file  Lifestyle  . Physical activity:    Days per week: Not on file    Minutes per session: Not on file  . Stress: Not on file  Relationships  . Social connections:    Talks on phone: Not on file    Gets together: Not on file    Attends religious service: Not on file    Active member of club or organization: Not on file    Attends meetings of clubs or organizations: Not on file    Relationship status: Not on file  . Intimate partner violence:    Fear of current or ex partner: Not on file    Emotionally abused: Not on file    Physically abused: Not on file    Forced sexual activity: Not on file  Other Topics Concern  . Not  on file  Social History Narrative  . Not on file    Review of Systems: General: Negative for anorexia, weight loss, fever, chills, fatigue, weakness. Eyes: Negative for vision changes.  ENT: Negative for hoarseness, difficulty swallowing , nasal congestion. CV: Negative for chest pain, angina, palpitations, dyspnea on exertion, peripheral edema.  Respiratory: Negative for dyspnea at rest, dyspnea on exertion, cough, sputum, wheezing.  GI: See history of present illness. GU:  Negative for dysuria, hematuria, urinary incontinence, urinary frequency, nocturnal urination.  MS: Negative for joint pain, low back pain.  Derm: Negative for rash or itching.  Neuro: Negative for weakness, abnormal sensation, seizure, frequent headaches, memory loss, confusion.  Psych: Negative for anxiety, depression, suicidal ideation, hallucinations.  Endo: Negative for  unusual weight change.  Heme: Negative for bruising or bleeding. Allergy: Negative for rash or hives.    Physical Exam: There were no vitals taken for this visit. General:   Alert and oriented. Pleasant and cooperative. Well-nourished and well-developed.  Head:  Normocephalic and atraumatic. Eyes:  Without icterus, sclera clear and conjunctiva pink.  Ears:  Normal auditory acuity. Mouth:  No deformity or lesions, oral mucosa pink.  Throat/Neck:  Supple, without mass or thyromegaly. Cardiovascular:  S1, S2 present without murmurs appreciated. Normal pulses noted. Extremities without clubbing or edema. Respiratory:  Clear to auscultation bilaterally. No wheezes, rales, or rhonchi. No distress.  Gastrointestinal:  +BS, soft, non-tender and non-distended. No HSM noted. No guarding or rebound. No masses appreciated.  Rectal:  Deferred  Musculoskalatal:  Symmetrical without gross deformities. Normal posture. Skin:  Intact without significant lesions or rashes. Neurologic:  Alert and oriented x4;  grossly normal neurologically. Psych:  Alert and cooperative. Normal mood and affect. Heme/Lymph/Immune: No significant cervical adenopathy. No excessive bruising noted.    03/30/2018 7:30 AM   Disclaimer: This note was dictated with voice recognition software. Similar sounding words can inadvertently be transcribed and may not be corrected upon review.

## 2018-03-30 NOTE — Telephone Encounter (Signed)
PATIENT WAS A NO SHOW AND LETTER SENT  °
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
